# Patient Record
Sex: Male | Born: 1995 | Race: Black or African American | Hispanic: No | Marital: Single | Smoking: Never smoker
Health system: Southern US, Community
[De-identification: ages and names within clinical notes are randomized; demographics above are authoritative.]

## PROBLEM LIST (undated history)

## (undated) DIAGNOSIS — E119 Type 2 diabetes mellitus without complications: Secondary | ICD-10-CM

---
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
## 2019-10-21 ENCOUNTER — Encounter (HOSPITAL_COMMUNITY): Payer: Self-pay

## 2019-10-21 ENCOUNTER — Inpatient Hospital Stay (HOSPITAL_COMMUNITY)
Admission: EM | Admit: 2019-10-21 | Discharge: 2019-10-25 | DRG: 638 | Disposition: A | Discharge status: Home / Self Care | Payer: Self-pay | Source: Ambulatory Visit | Attending: Family Medicine | Admitting: Family Medicine

## 2019-10-21 ENCOUNTER — Ambulatory Visit (HOSPITAL_COMMUNITY)
Admission: EM | Admit: 2019-10-21 | Discharge: 2019-10-21 | Disposition: A | Discharge status: Home / Self Care | Payer: Self-pay | Attending: Family Medicine | Admitting: Family Medicine

## 2019-10-21 ENCOUNTER — Inpatient Hospital Stay (HOSPITAL_COMMUNITY): Payer: Self-pay

## 2019-10-21 ENCOUNTER — Emergency Department (HOSPITAL_COMMUNITY): Payer: Self-pay

## 2019-10-21 ENCOUNTER — Other Ambulatory Visit: Payer: Self-pay

## 2019-10-21 ENCOUNTER — Encounter (HOSPITAL_COMMUNITY): Payer: Self-pay | Admitting: Emergency Medicine

## 2019-10-21 DIAGNOSIS — E871 Hypo-osmolality and hyponatremia: Secondary | ICD-10-CM | POA: Diagnosis present

## 2019-10-21 DIAGNOSIS — N179 Acute kidney failure, unspecified: Secondary | ICD-10-CM

## 2019-10-21 DIAGNOSIS — E860 Dehydration: Secondary | ICD-10-CM | POA: Diagnosis present

## 2019-10-21 DIAGNOSIS — Z20822 Contact with and (suspected) exposure to covid-19: Secondary | ICD-10-CM | POA: Diagnosis present

## 2019-10-21 DIAGNOSIS — Z6841 Body Mass Index (BMI) 40.0 and over, adult: Secondary | ICD-10-CM

## 2019-10-21 DIAGNOSIS — R030 Elevated blood-pressure reading, without diagnosis of hypertension: Secondary | ICD-10-CM

## 2019-10-21 DIAGNOSIS — I10 Essential (primary) hypertension: Secondary | ICD-10-CM | POA: Diagnosis present

## 2019-10-21 DIAGNOSIS — E1110 Type 2 diabetes mellitus with ketoacidosis without coma: Principal | ICD-10-CM | POA: Diagnosis present

## 2019-10-21 DIAGNOSIS — R1033 Periumbilical pain: Secondary | ICD-10-CM

## 2019-10-21 DIAGNOSIS — E119 Type 2 diabetes mellitus without complications: Secondary | ICD-10-CM

## 2019-10-21 DIAGNOSIS — R739 Hyperglycemia, unspecified: Secondary | ICD-10-CM

## 2019-10-21 DIAGNOSIS — D72829 Elevated white blood cell count, unspecified: Secondary | ICD-10-CM | POA: Diagnosis present

## 2019-10-21 DIAGNOSIS — K760 Fatty (change of) liver, not elsewhere classified: Secondary | ICD-10-CM | POA: Diagnosis present

## 2019-10-21 DIAGNOSIS — E876 Hypokalemia: Secondary | ICD-10-CM | POA: Diagnosis present

## 2019-10-21 DIAGNOSIS — E1310 Other specified diabetes mellitus with ketoacidosis without coma: Secondary | ICD-10-CM

## 2019-10-21 HISTORY — DX: Type 2 diabetes mellitus without complications: E11.9

## 2019-10-21 LAB — BASIC METABOLIC PANEL
Anion gap: 23 — ABNORMAL HIGH (ref 5–15)
Anion gap: 18 — ABNORMAL HIGH (ref 5–15)
BUN: 11 mg/dL (ref 6–20)
BUN: 10 mg/dL (ref 6–20)
CO2: 14 mmol/L — ABNORMAL LOW (ref 22–32)
CO2: 13 mmol/L — ABNORMAL LOW (ref 22–32)
Calcium: 10.1 mg/dL (ref 8.9–10.3)
Calcium: 9.6 mg/dL (ref 8.9–10.3)
Chloride: 95 mmol/L — ABNORMAL LOW (ref 98–111)
Chloride: 103 mmol/L (ref 98–111)
Creatinine, Ser: 1.42 mg/dL — ABNORMAL HIGH (ref 0.61–1.24)
Creatinine, Ser: 1.24 mg/dL (ref 0.61–1.24)
GFR calc Af Amer: >60 mL/min (ref >60)
GFR calc Af Amer: >60 mL/min (ref >60)
GFR calc non Af Amer: >60 mL/min (ref >60)
GFR calc non Af Amer: >60 mL/min (ref >60)
Glucose, Bld: 647 mg/dL (ref 70–99)
Glucose, Bld: 392 mg/dL — ABNORMAL HIGH (ref 70–99)
Potassium: 4.7 mmol/L (ref 3.5–5.1)
Potassium: 4.7 mmol/L (ref 3.5–5.1)
Sodium: 132 mmol/L — ABNORMAL LOW (ref 135–145)
Sodium: 134 mmol/L — ABNORMAL LOW (ref 135–145)

## 2019-10-21 LAB — CBG MONITORING, ED
Glucose-Capillary: 260 mg/dL — ABNORMAL HIGH (ref 70–99)
Glucose-Capillary: 275 mg/dL — ABNORMAL HIGH (ref 70–99)
Glucose-Capillary: 306 mg/dL — ABNORMAL HIGH (ref 70–99)
Glucose-Capillary: 326 mg/dL — ABNORMAL HIGH (ref 70–99)
Glucose-Capillary: 382 mg/dL — ABNORMAL HIGH (ref 70–99)
Glucose-Capillary: 432 mg/dL — ABNORMAL HIGH (ref 70–99)
Glucose-Capillary: 512 mg/dL (ref 70–99)
Glucose-Capillary: 536 mg/dL (ref 70–99)
Glucose-Capillary: >600 mg/dL (ref 70–99)

## 2019-10-21 LAB — HEPATIC FUNCTION PANEL
ALT: 50 U/L — ABNORMAL HIGH (ref 0–44)
AST: 44 U/L — ABNORMAL HIGH (ref 15–41)
Albumin: 4.6 g/dL (ref 3.5–5.0)
Alkaline Phosphatase: 117 U/L (ref 38–126)
Bilirubin, Direct: 0.4 mg/dL — ABNORMAL HIGH (ref 0.0–0.2)
Indirect Bilirubin: 1.6 mg/dL — ABNORMAL HIGH (ref 0.3–0.9)
Total Bilirubin: 2 mg/dL — ABNORMAL HIGH (ref 0.3–1.2)
Total Protein: 9.2 g/dL — ABNORMAL HIGH (ref 6.5–8.1)

## 2019-10-21 LAB — CBC
HCT: 50.8 % (ref 39.0–52.0)
HCT: 51.9 % (ref 39.0–52.0)
Hemoglobin: 16.5 g/dL (ref 13.0–17.0)
Hemoglobin: 16.6 g/dL (ref 13.0–17.0)
MCH: 25 pg — ABNORMAL LOW (ref 26.0–34.0)
MCH: 25.1 pg — ABNORMAL LOW (ref 26.0–34.0)
MCHC: 32.5 g/dL (ref 30.0–36.0)
MCHC: 32 g/dL (ref 30.0–36.0)
MCV: 77.1 fL — ABNORMAL LOW (ref 80.0–100.0)
MCV: 78.5 fL — ABNORMAL LOW (ref 80.0–100.0)
Platelets: 442 10*3/uL — ABNORMAL HIGH (ref 150–400)
Platelets: 390 10*3/uL (ref 150–400)
RBC: 6.59 MIL/uL — ABNORMAL HIGH (ref 4.22–5.81)
RBC: 6.61 MIL/uL — ABNORMAL HIGH (ref 4.22–5.81)
RDW: 14.5 % (ref 11.5–15.5)
RDW: 15.1 % (ref 11.5–15.5)
WBC: 17 10*3/uL — ABNORMAL HIGH (ref 4.0–10.5)
WBC: 20.3 10*3/uL — ABNORMAL HIGH (ref 4.0–10.5)
nRBC: 0 % (ref 0.0–0.2)
nRBC: 0 % (ref 0.0–0.2)

## 2019-10-21 LAB — POCT I-STAT EG7
Acid-base deficit: 11 mmol/L — ABNORMAL HIGH (ref 0.0–2.0)
Bicarbonate: 13.4 mmol/L — ABNORMAL LOW (ref 20.0–28.0)
Calcium, Ion: 1.18 mmol/L (ref 1.15–1.40)
HCT: 53 % — ABNORMAL HIGH (ref 39.0–52.0)
Hemoglobin: 18 g/dL — ABNORMAL HIGH (ref 13.0–17.0)
O2 Saturation: 99 %
Potassium: 5.1 mmol/L (ref 3.5–5.1)
Sodium: 131 mmol/L — ABNORMAL LOW (ref 135–145)
TCO2: 14 mmol/L — ABNORMAL LOW (ref 22–32)
pCO2, Ven: 26.6 mmHg — ABNORMAL LOW (ref 44.0–60.0)
pH, Ven: 7.311 (ref 7.250–7.430)
pO2, Ven: 127 mmHg — ABNORMAL HIGH (ref 32.0–45.0)

## 2019-10-21 LAB — URINALYSIS, ROUTINE W REFLEX MICROSCOPIC
Bacteria, UA: NONE SEEN
Bilirubin Urine: NEGATIVE
Glucose, UA: >=500 mg/dL — AB
Ketones, ur: 80 mg/dL — AB
Leukocytes,Ua: NEGATIVE
Nitrite: NEGATIVE
Protein, ur: >=300 mg/dL — AB
Specific Gravity, Urine: 1.03 (ref 1.005–1.030)
pH: 5 (ref 5.0–8.0)

## 2019-10-21 LAB — POCT URINALYSIS DIP (DEVICE)
Glucose, UA: 500 mg/dL — AB
Ketones, ur: >=160 mg/dL — AB
Leukocytes,Ua: NEGATIVE
Nitrite: NEGATIVE
Protein, ur: >=300 mg/dL — AB
Specific Gravity, Urine: >=1.03 (ref 1.005–1.030)
Urobilinogen, UA: 0.2 mg/dL (ref 0.0–1.0)
pH: 5.5 (ref 5.0–8.0)

## 2019-10-21 LAB — HIV ANTIBODY (ROUTINE TESTING W REFLEX): HIV Screen 4th Generation wRfx: NONREACTIVE

## 2019-10-21 LAB — LIPASE, BLOOD: Lipase: 20 U/L (ref 11–51)

## 2019-10-21 LAB — LACTIC ACID, PLASMA
Lactic Acid, Venous: 3.6 mmol/L (ref 0.5–1.9)
Lactic Acid, Venous: 2.1 mmol/L (ref 0.5–1.9)

## 2019-10-21 LAB — GLUCOSE, CAPILLARY: Glucose-Capillary: 536 mg/dL (ref 70–99)

## 2019-10-21 LAB — D-DIMER, QUANTITATIVE: D-Dimer, Quant: 0.31 ug/mL-FEU (ref 0.00–0.50)

## 2019-10-21 LAB — CREATININE, SERUM
Creatinine, Ser: 1.21 mg/dL (ref 0.61–1.24)
GFR calc Af Amer: >60 mL/min (ref >60)
GFR calc non Af Amer: >60 mL/min (ref >60)

## 2019-10-21 LAB — BETA-HYDROXYBUTYRIC ACID: Beta-Hydroxybutyric Acid: 3.94 mmol/L — ABNORMAL HIGH (ref 0.05–0.27)

## 2019-10-21 LAB — SARS CORONAVIRUS 2 (TAT 6-24 HRS): SARS Coronavirus 2: NEGATIVE

## 2019-10-21 MED ORDER — ENOXAPARIN SODIUM 100 MG/ML ~~LOC~~ SOLN
100.0000 mg | SUBCUTANEOUS | Status: DC
  Administered 2019-10-21 – 2019-10-24 (×4): 100 mg via SUBCUTANEOUS
  Filled 2019-10-21 (×7): qty 1

## 2019-10-21 MED ORDER — SODIUM CHLORIDE 0.9 % IV BOLUS
1000.0000 mL | Freq: Once | INTRAVENOUS | Status: AC
  Administered 2019-10-21: 1000 mL via INTRAVENOUS

## 2019-10-21 MED ORDER — TRAZODONE HCL 50 MG PO TABS: 25.0000 mg | ORAL_TABLET | Freq: Every evening | ORAL | Status: DC | PRN

## 2019-10-21 MED ORDER — ONDANSETRON HCL 4 MG PO TABS: 4.0000 mg | ORAL_TABLET | Freq: Four times a day (QID) | ORAL | Status: DC | PRN

## 2019-10-21 MED ORDER — TRAMADOL HCL 50 MG PO TABS: 50.0000 mg | ORAL_TABLET | Freq: Four times a day (QID) | ORAL | Status: DC | PRN

## 2019-10-21 MED ORDER — POTASSIUM CHLORIDE 10 MEQ/100ML IV SOLN
10.0000 meq | INTRAVENOUS | Status: AC
  Administered 2019-10-21 (×2): 10 meq via INTRAVENOUS
  Filled 2019-10-21 (×2): qty 100

## 2019-10-21 MED ORDER — ONDANSETRON HCL 4 MG/2ML IJ SOLN: 4.0000 mg | Freq: Four times a day (QID) | INTRAMUSCULAR | Status: DC | PRN

## 2019-10-21 MED ORDER — ACETAMINOPHEN 650 MG RE SUPP: 650.0000 mg | Freq: Four times a day (QID) | RECTAL | Status: DC | PRN

## 2019-10-21 MED ORDER — PIPERACILLIN-TAZOBACTAM 3.375 G IVPB 30 MIN
3.3750 g | Freq: Once | INTRAVENOUS | Status: AC
  Administered 2019-10-21: 3.375 g via INTRAVENOUS
  Filled 2019-10-21: qty 50

## 2019-10-21 MED ORDER — DOCUSATE SODIUM 100 MG PO CAPS
100.0000 mg | ORAL_CAPSULE | Freq: Two times a day (BID) | ORAL | Status: DC
  Administered 2019-10-21 – 2019-10-25 (×7): 100 mg via ORAL
  Filled 2019-10-21 (×7): qty 1

## 2019-10-21 MED ORDER — HYDRALAZINE HCL 25 MG PO TABS: 25.0000 mg | ORAL_TABLET | Freq: Four times a day (QID) | ORAL | Status: DC | PRN

## 2019-10-21 MED ORDER — ACETAMINOPHEN 325 MG PO TABS: 650.0000 mg | ORAL_TABLET | Freq: Four times a day (QID) | ORAL | Status: DC | PRN

## 2019-10-21 MED ORDER — INSULIN REGULAR(HUMAN) IN NACL 100-0.9 UT/100ML-% IV SOLN
INTRAVENOUS | Status: DC
  Administered 2019-10-21: 18 [IU]/h via INTRAVENOUS
  Administered 2019-10-22: 12 [IU]/h via INTRAVENOUS
  Administered 2019-10-22: 19 [IU]/h via INTRAVENOUS
  Administered 2019-10-22: 16 [IU]/h via INTRAVENOUS
  Administered 2019-10-23: 18 [IU]/h via INTRAVENOUS
  Administered 2019-10-23: 8.5 [IU]/h via INTRAVENOUS
  Administered 2019-10-24: 9 [IU]/h via INTRAVENOUS
  Filled 2019-10-21 (×8): qty 100

## 2019-10-21 MED ORDER — MAGNESIUM HYDROXIDE 400 MG/5ML PO SUSP: 30.0000 mL | Freq: Every day | ORAL | Status: DC | PRN

## 2019-10-21 MED ORDER — DEXTROSE 50 % IV SOLN: 0.0000 mL | INTRAVENOUS | Status: DC | PRN

## 2019-10-21 MED ORDER — PIPERACILLIN-TAZOBACTAM 3.375 G IVPB: 3.3750 g | Freq: Three times a day (TID) | INTRAVENOUS | Status: DC

## 2019-10-21 MED ORDER — SODIUM CHLORIDE 0.9 % IV SOLN: INTRAVENOUS | Status: DC

## 2019-10-21 MED ORDER — BISACODYL 10 MG RE SUPP: 10.0000 mg | Freq: Every day | RECTAL | Status: DC | PRN

## 2019-10-21 MED ORDER — DEXTROSE-NACL 5-0.45 % IV SOLN: INTRAVENOUS | Status: DC

## 2019-10-21 MED ORDER — FLEET ENEMA 7-19 GM/118ML RE ENEM: 1.0000 | ENEMA | Freq: Once | RECTAL | Status: DC | PRN

## 2019-10-21 NOTE — ED Notes (Signed)
160/116 reported to [employer]

## 2019-10-21 NOTE — ED Provider Notes (Signed)
[facility] EMERGENCY DEPARTMENT Provider Note   CSN: [id number] Arrival date & time: 10/21/19  1321     History Chief Complaint  Patient presents with  . Hyperglycemia  . Tachycardia    Michael [name] is a 24 y.o. male who presents to the ED today from urgent care for new onset hyperglycemia. Pt reports that 3 days ago he began having constipation, nausea, and nonbloody nonbilious emesis. He also complains of urinary frequency and SOB. Pt states he went to UC today and was sent here for further evaluation. His blood sugar was 536 and he was found to have > 500 glucose in his urine with 160 ketones and 300 protein. Per chart review they were unable to obtain imaging of his abdomen here due to body habitus and was sent here for further workup. Pt has no hx of diabetes. It appears that he does not frequently go to the doctors as there are no previous notes on him. Pt denies recent sick contacts. Denies fever, chills, chest pain, hematemesis, coffee ground emesis, or any other associated symptoms.   The history is provided by the patient and medical records. No language interpreter was used.       Past Medical History:  Diagnosis Date  . Diabetes mellitus without complication [facility])     Patient Active Problem List   Diagnosis Date Noted  . Sports physical 08/12/2012    History reviewed. No pertinent surgical history.     Family History  Problem Relation Age of Onset  . Sudden death Neg Hx   . Heart attack Neg Hx     Social History   Tobacco Use  . Smoking status: Never Smoker  . Smokeless tobacco: Never Used  Substance Use Topics  . Alcohol use: Not Currently  . Drug use: Not Currently    Home Medications Prior to Admission medications   Not on File    Allergies    Patient has no known allergies.  Review of Systems   Review of Systems  Constitutional: Negative for chills and fever.  HENT: Negative for congestion.   Eyes: Negative for visual  disturbance.  Respiratory: Positive for shortness of breath. Negative for cough.   Cardiovascular: Negative for chest pain and leg swelling.  Gastrointestinal: Positive for abdominal pain, constipation, nausea and vomiting. Negative for blood in stool and diarrhea.  Genitourinary: Positive for frequency.  Musculoskeletal: Negative for myalgias.  Skin: Negative for rash.  Neurological: Negative for headaches.    Physical Exam Updated Vital Signs BP (!) 140/120 (BP Location: Left Arm)   Pulse (!) 139   Temp 97.9 F (36.6 C) (Oral)   Resp 17   SpO2 99%   Physical Exam Vitals and nursing note reviewed.  Constitutional:      Appearance: He is obese. He is not ill-appearing or diaphoretic.  HENT:     Head: Normocephalic and atraumatic.  Eyes:     Conjunctiva/sclera: Conjunctivae normal.  Cardiovascular:     Rate and Rhythm: Regular rhythm. Tachycardia present.  Pulmonary:     Effort: Pulmonary effort is normal.     Breath sounds: Normal breath sounds. No wheezing, rhonchi or rales.  Chest:     Chest wall: No tenderness.  Abdominal:     Palpations: Abdomen is soft.     Tenderness: There is abdominal tenderness. There is no guarding or rebound.     Comments: Soft, + periumbilical abdominal TTP, +BS throughout, no r/g/r, neg murphy's, neg mcburney's, no CVA TTP  Musculoskeletal:     Cervical back: Neck supple.  Skin:    General: Skin is warm and dry.  Neurological:     Mental Status: He is alert.     ED Results / Procedures / Treatments   Labs (all labs ordered are listed, but only abnormal results are displayed) Labs Reviewed  BASIC METABOLIC PANEL - Abnormal; Notable for the following components:      Result Value   Sodium 132 (*)    Chloride 95 (*)    CO2 14 (*)    Glucose, Bld 647 (*)    Creatinine, Ser 1.42 (*)    Anion gap 23 (*)    All other components within normal limits  CBC - Abnormal; Notable for the following components:   WBC 17.0 (*)    RBC 6.59 (*)     MCV 77.1 (*)    MCH 25.0 (*)    Platelets 442 (*)    All other components within normal limits  URINALYSIS, ROUTINE W REFLEX MICROSCOPIC - Abnormal; Notable for the following components:   Color, Urine STRAW (*)    Glucose, UA >=500 (*)    Hgb urine dipstick MODERATE (*)    Ketones, ur 80 (*)    Protein, ur >=300 (*)    All other components within normal limits  LACTIC ACID, PLASMA - Abnormal; Notable for the following components:   Lactic Acid, Venous 3.6 (*)    All other components within normal limits  BETA-HYDROXYBUTYRIC ACID - Abnormal; Notable for the following components:   Beta-Hydroxybutyric Acid 3.94 (*)    All other components within normal limits  HEPATIC FUNCTION PANEL - Abnormal; Notable for the following components:   Total Protein 9.2 (*)    AST 44 (*)    ALT 50 (*)    Total Bilirubin 2.0 (*)    Bilirubin, Direct 0.4 (*)    Indirect Bilirubin 1.6 (*)    All other components within normal limits  CBG MONITORING, ED - Abnormal; Notable for the following components:   Glucose-Capillary >600 (*)    All other components within normal limits  CBG MONITORING, ED - Abnormal; Notable for the following components:   Glucose-Capillary 512 (*)    All other components within normal limits  CBG MONITORING, ED - Abnormal; Notable for the following components:   Glucose-Capillary 432 (*)    All other components within normal limits  POCT I-STAT EG7 - Abnormal; Notable for the following components:   pCO2, Ven 26.6 (*)    pO2, Ven 127.0 (*)    Bicarbonate 13.4 (*)    TCO2 14 (*)    Acid-base deficit 11.0 (*)    Sodium 131 (*)    HCT 53.0 (*)    Hemoglobin 18.0 (*)    All other components within normal limits  SARS CORONAVIRUS 2 (TAT 6-24 HRS)  LIPASE, BLOOD  LACTIC ACID, PLASMA  I-STAT VENOUS BLOOD GAS, ED  CBG MONITORING, ED    EKG EKG Interpretation  Date/Time:  Tuesday October 21 2019 13:50:08 EST Ventricular Rate:  136 PR Interval:  160 QRS  Duration: 74 QT Interval:  272 QTC Calculation: 409 R Axis:   54 Text Interpretation: Sinus tachycardia Right atrial enlargement Possible Anterior infarct , age undetermined Abnormal ECG No prior ECG for comparison. No STEMI Confirmed by Theda Belfast ([id number]) on 10/21/2019 3:31:13 PM   Radiology DG Abd Acute W/Chest  Result Date: 10/21/2019 CLINICAL DATA:  No pain with apparent constipation EXAM: DG ABDOMEN ACUTE  W/ 1V CHEST COMPARISON:  None. FINDINGS: PA chest: Lungs are clear. Heart size and pulmonary vascularity are normal. No adenopathy. Supine and upright abdomen: There is moderate stool throughout the colon. There is no bowel dilatation or air-fluid level to suggest bowel obstruction. No free air. No abnormal calcifications. IMPRESSION: Moderate stool evident throughout the colon. No bowel dilatation or air-fluid level to suggest bowel obstruction. No free air. Lungs clear. Electronically Signed   By: Lowella Grip III M.D.   On: 10/21/2019 15:55    Procedures .Critical Care Performed by: Eustaquio Maize, PA-C Authorized by: Eustaquio Maize, PA-C   Critical care provider statement:    Critical care time (minutes):  45   Critical care was necessary to treat or prevent imminent or life-threatening deterioration of the following conditions:  Endocrine crisis   Critical care was time spent personally by me on the following activities:  Discussions with consultants, evaluation of patient's response to treatment, examination of patient, ordering and performing treatments and interventions, ordering and review of laboratory studies, ordering and review of radiographic studies, pulse oximetry, re-evaluation of patient's condition, obtaining history from patient or surrogate and review of old charts   (including critical care time)  Medications Ordered in ED Medications  insulin regular, human (MYXREDLIN) 100 units/ 100 mL infusion (11 Units/hr Intravenous Rate/Dose Change 10/21/19 1655)   0.9 %  sodium chloride infusion (has no administration in time range)  dextrose 5 %-0.45 % sodium chloride infusion (has no administration in time range)  dextrose 50 % solution 0-50 mL (has no administration in time range)  potassium chloride 10 mEq in 100 mL IVPB (10 mEq Intravenous New Bag/Given 10/21/19 1650)  sodium chloride 0.9 % bolus 1,000 mL (1,000 mLs Intravenous New Bag/Given 10/21/19 1523)  sodium chloride 0.9 % bolus 1,000 mL (1,000 mLs Intravenous New Bag/Given 10/21/19 1650)    ED Course  I have reviewed the triage vital signs and the nursing notes.  Pertinent labs & imaging results that were available during my care of the patient were reviewed by me and considered in my medical decision making (see chart for details).  24 year old male presents the ED today from urgent care for evaluation of new onset hyperglycemia/concern for DKA.  Patient without history of diabetes.  Initially went to urgent care today for constipation, periumbilical abdominal pain, nausea, vomiting, shortness of breath.  Found to have a CBG in the 500s and sent here for further evaluation.  They were also unable to obtain any imaging due to patient's body habitus, he weighs 213 kg.   On exam today patient is noted to be tachycardic and tachypneic although afebrile.  He is satting appropriately and not using any accessory muscles.  He has some very mild periumbilical nominal pain without peritoneal signs.  He reports constipation for the past 3 days and urinary frequency, suspect that the tachycardia is due to dehydration.  CBG obtained prior to patient being seen greater than 600.  Awaiting all labs although CBC has returned with elevated white blood cell count of 17,000, do not have baseline to compare to.   Lactic acid elevated at 3.6.  Again feel that this is likely due to dehydration.  Patient is currently receiving a fluid bolus.  Will obtain VBG and beta hydroxybutyrate and treat as DKA.  Patient will need  to be admitted given new onset.  He does not have a PCP currently and will likely need social work/case management involvement to get him a PCP to treat  his diabetes outpatient.   Abdominal series with chest obtained with moderate stool burden, no small bowel obstruction.  No infection in the chest.  BMP with glucose of 647 bicarb 14 anion gap 23.  Creatinine 1.42.  VBG with pH of 7.3, CO2 of 26, bicarb of 13.  It does not appear patient is fully in DKA yet but will likely get there soon.  He is currently being treated with insulin drip.  Will call for admission.   Clinical Course as of Oct 20 1738  Tue Oct 21, 2019  1705 WBC(!): 17.0 [MV]  1705 Discussed case with Dr. Garald Braver who agrees to accept patient for admission    [MV]  1706 Glucose(!!): 647 [MV]  1706 Creatinine(!): 1.42 [MV]  1706 CO2(!): 14 [MV]  1706 Anion gap(!): 23 [MV]  1706 Beta-Hydroxybutyric Acid(!): 3.94 [MV]    Clinical Course User Index [MV] Tanda Rockers, PA-C   MDM Rules/Calculators/A&P                       Final Clinical Impression(s) / ED Diagnoses Final diagnoses:  Hyperglycemia  Diabetic ketoacidosis without coma associated with type 2 diabetes mellitus [facility])    Rx / DC Orders ED Discharge Orders    None       Tanda Rockers, PA-C 10/21/19 1740    Tegeler, Canary Brim, MD 10/21/19 2348

## 2019-10-21 NOTE — ED Notes (Signed)
Dinner Tray Ordered @ 1807.  

## 2019-10-21 NOTE — ED Notes (Signed)
Patient transported to X-ray 

## 2019-10-21 NOTE — ED Triage Notes (Signed)
Pt presents from UC. He is newly dx diabetic. C/o n/v/tachycardia/abdominal. CBG at triage >600. See provider notes below. Steady gait, a/o speaking clear sentences at triage.   Unable to image abdomen here secondary to body habitus. Given blood sugar and U/A findings along with n/v/tachycardia/abdominal pain, I have sent him to the ED for further evaluation of likely DKA.

## 2019-10-21 NOTE — ED Triage Notes (Signed)
Pt states he is constipated x 2 days. Pt states he has been voiding a lot in the past Yoon days.

## 2019-10-21 NOTE — Discharge Instructions (Signed)
I am sending you to the Emergency Department because your blood sugar here is 536. This is very high and can be very dangerous if left untreated.

## 2019-10-21 NOTE — ED Provider Notes (Addendum)
[facility]ENTER   [id number] 10/21/19 Arrival Time: 1046  ASSESSMENT & PLAN:  1. Periumbilical abdominal pain   2. New onset type 2 diabetes mellitus (HCC)      Labs Reviewed  CBG MONITORING, ED - Abnormal; Notable for the following components:   Glucose-Capillary 536 (*)    All other components within normal limits  POCT URINALYSIS DIP (DEVICE) - Abnormal; Notable for the following components:   Glucose, UA 500 (*)    Bilirubin Urine MODERATE (*)    Ketones, ur >=160 (*)    Hgb urine dipstick MODERATE (*)    Protein, ur >=300 (*)    All other components within normal limits    Unable to image abdomen here secondary to body habitus. Given blood sugar and U/A findings along with n/v/tachycardia/abdominal pain, I have sent him to the ED for further evaluation of likely DKA.  Prefers private vehicle transport; family member. Stable upon d/c here.   Reviewed expectations re: course of current medical issues. Questions answered. Outlined signs and symptoms indicating need for more acute intervention. Patient verbalized understanding. After Visit Summary given.   SUBJECTIVE: History from: patient. Michael [name] is a 24 y.o. male who presents with complaint of fairly persistent periumbilical abdominal pain. Relates that he has not been able to have a bowel movement for three days; feels this is causing pain. Described as cramping and dull; without radiation; does not wake him at night but finding it difficult to sleep as he reports significant nocturia. Also very thirsty recently. Reports that he is passing gas from his rectum but not as much as usual. Symptoms are gradually worsening since beginning. Fever: not believed to be present. Aggravating factors: have not been identified. Alleviating factors: have not been identified. Associated symptoms: nausea and vomiting. He denies diarrhea, headache and myalgias. Appetite: decreased. PO intake: decreased. Ambulatory without  assistance. Urinary symptoms: frequent urination; no hematuria; no penile discharge. No recent illnesses reported. History of similar: no. OTC treatment: Pepto-Bismol without relief.  History reviewed. No pertinent surgical history.  ROS: As per HPI. All other systems negative.  OBJECTIVE:  Vitals:   10/21/19 1155 10/21/19 1157  BP: (!) 160/116   Pulse: (!) 115   Resp: 18   Temp: 99 F (37.2 C)   TempSrc: Oral   SpO2: 100%   Weight:  (!) 213.2 kg    GCS: 15 General appearance: alert, oriented, no acute distress HEENT: [place]; AT; oropharynx dry Lungs: clear to auscultation bilaterally; unlabored respirations Heart: regular; tachycardic Abdomen: difficult exam secondary to body habitus/obesity; soft; without obvious distention; moderate and poorly localized tenderness to palpation over mid to lower abdomen; difficulty appreciating bowel sounds; without obvious masses or organomegaly; without guarding or rebound tenderness Back: without CVA tenderness; FROM at waist Extremities: without LE edema; symmetrical; without gross deformities Skin: warm and dry Neurologic: normal gait Psychological: alert and cooperative; normal mood and affect  Labs: No results found for this or any previous visit. Labs Reviewed  CBG MONITORING, ED     No Known Allergies                                             PMH: "Healthy".  Social History   Socioeconomic History  . Marital status: Single    Spouse name: Not on file  . Number of children: Not on file  . Years  of education: Not on file  . Highest education level: Not on file  Occupational History  . Not on file  Tobacco Use  . Smoking status: Never Smoker  . Smokeless tobacco: Never Used  Substance and Sexual Activity  . Alcohol use: Not on file  . Drug use: Not on file  . Sexual activity: Not on file  Other Topics Concern  . Not on file  Social History Narrative  . Not on file   Social Determinants of Health   Financial  Resource Strain:   . Difficulty of Paying Living Expenses: Not on file  Food Insecurity:   . Worried About Charity fundraiser in the Last Year: Not on file  . Ran Out of Food in the Last Year: Not on file  Transportation Needs:   . Lack of Transportation (Medical): Not on file  . Lack of Transportation (Non-Medical): Not on file  Physical Activity:   . Days of Exercise per Week: Not on file  . Minutes of Exercise per Session: Not on file  Stress:   . Feeling of Stress : Not on file  Social Connections:   . Frequency of Communication with Friends and Family: Not on file  . Frequency of Social Gatherings with Friends and Family: Not on file  . Attends Religious Services: Not on file  . Active Member of Clubs or Organizations: Not on file  . Attends Archivist Meetings: Not on file  . Marital Status: Not on file  Intimate Partner Violence:   . Fear of Current or Ex-Partner: Not on file  . Emotionally Abused: Not on file  . Physically Abused: Not on file  . Sexually Abused: Not on file   Family History  Problem Relation Age of Onset  . Sudden death Neg Hx   . Heart attack Neg Hx   No FH of DM reported.   Vanessa Kick, MD 10/21/19 1316    Vanessa Kick, MD 10/21/19 [phone]

## 2019-10-21 NOTE — ED Notes (Signed)
IOt should be noted that the PT's BP cuff is on his forearm and has been the whole time. Had difficulty getting cuff on upper arm for better reading. Pt. Alert oriented and over all comfortable

## 2019-10-21 NOTE — ED Notes (Signed)
Patient is being discharged from the Urgent Care Center and sent to the Emergency Department via wheelchair by staff. Per Dr Tracie Harrier, patient is stable but in need of higher level of care due to hyperglycemia. Patient is aware and verbalizes understanding of plan of care. Dr Tracie Harrier reviewed plan of care with patient and patient left for ED after provider discussion Vitals:   10/21/19 1155  BP: (!) 160/116  Pulse: (!) 115  Resp: 18  Temp: 99 F (37.2 C)  SpO2: 100%

## 2019-10-21 NOTE — H&P (Signed)
[facility] History and Physical  Friend Dorfman PFX:[id number] DOB: [birth year] DOA: 10/21/2019  Referring physician: ED provider PCP: Patient, No Pcp Per   Chief Complaint: Abdominal pain, constipation  HPI: Michael [name] is a 24 y.o. male with no regular medical care with PMH of morbid obesity with BMI of 60 who presented to the Urgent care today for evaluation of periumbilical abdominal pain with constipation x3 days. His symptoms are described as moderated in intensity, progressively worsened and were associated with nausea, and non bloody, non bilious emesis.  He denied shortness of breath, fever, or chills. At the Urgent care he was found to have blood sugar above 500 with urine ketones of 160 and 300 protein.  He was transported to the ED for further evaluation.    In the ED his abdominal exam was benign with no peritonitis, mild periumbilical pain and normal bowel sounds. CXR showed no consolidation. Acute abd Xray showed no pattern of obstruction and moderate stool burden. His labwork was consistent with DKA with of 23. Scr was elevated at 1.42. Blood glucose was 647. UA was negative for UTI. COVID 19 test was ordered and is pending. He had leukocytosis with WBC of 17K. The hospitalist was called for this admission to the hospital.   Review of Systems:  Constitutional:  No weight loss, night sweats, Fevers, chills, fatigue.  HEENT:  No headaches, Difficulty swallowing Cardio-vascular:  No chest pain, Orthopnea GI:  + abdominal pain, + nausea, +vomiting, + change in bowel habits Resp:  No shortness of breath with exertion or at rest. no productive cough Skin:  no rash or lesions.  GU:  no dysuria, change in color of urine, no urgency or frequency.   Musculoskeletal:  No joint pain or swelling. No decreased range of motion. No back pain.  Psych:  No change in mood or affect. No depression or anxiety. No memory loss.  Heme:  No easy bruising, no spontaneous bleeding.   Past  Medical History:  Diagnosis Date  . Diabetes mellitus without complication (HCC)    History reviewed. No pertinent surgical history. Social History:  reports that he has never smoked. He has never used smokeless tobacco. He reports previous alcohol use. He reports previous drug use.  No Known Allergies  Family History  Problem Relation Age of Onset  . Sudden death Neg Hx   . Heart attack Neg Hx     Prior to Admission medications   Not on File   Physical Exam: Vitals:   10/21/19 1338 10/21/19 1525 10/21/19 1620 10/21/19 1645  BP: (!) 140/120   (!) 163/114  Pulse: (!) 139 (!) 125  (!) 125  Resp: 17 (!) 29  (!) 25  Temp: 97.9 F (36.6 C)     TempSrc: Oral     SpO2: 99% 96%  96%  Height:   6\' 2"  (1.88 m)     Wt Readings from Last 3 Encounters:  10/21/19 (!) 213.2 kg  08/12/12 135.3 kg (>99 %, Z= 3.60)*   * Growth percentiles are based on CDC (Boys, 2-20 Years) data.    General:  Appears calm and comfortable Eyes: PERRL, normal lids, irises & conjunctiva.  ENT: grossly normal hearing, lips & tongue. Dry MM. Neck: supple, nl ROM.  Cardiovascular: S1 and S2, tachycardia.  Respiratory: No respiratory distress, no wheezing.  Abdomen: soft, ND, periumbilical TTP with no rebound tenderness.  Skin: no rash or induration seen on limited exam Musculoskeletal: grossly normal tone BUE/BLE Psychiatric: appropriate mood and  affect Neurologic: Alert and oriented x3, no facial droop.  GU: No CVA tenderness, no suprapubic tenderness          Labs on Admission:  Basic Metabolic Panel: Recent Labs  Lab 10/21/19 1430 10/21/19 1531  NA 132* 131*  K 4.7 5.1  CL 95*  --   CO2 14*  --   GLUCOSE 647*  --   BUN 11  --   CREATININE 1.42*  --   CALCIUM 10.1  --    Liver Function Tests: Recent Labs  Lab 10/21/19 1522  AST 44*  ALT 50*  ALKPHOS 117  BILITOT 2.0*  PROT 9.2*  ALBUMIN 4.6   Recent Labs  Lab 10/21/19 1522  LIPASE 20   No results for input(s): AMMONIA in  the last 168 hours. CBC: Recent Labs  Lab 10/21/19 1430 10/21/19 1531  WBC 17.0*  --   HGB 16.5 18.0*  HCT 50.8 53.0*  MCV 77.1*  --   PLT 442*  --    Cardiac Enzymes: No results for input(s): CKTOTAL, CKMB, CKMBINDEX, TROPONINI in the last 168 hours.  BNP (last 3 results) No results for input(s): BNP in the last 8760 hours.  ProBNP (last 3 results) No results for input(s): PROBNP in the last 8760 hours.  CBG: Recent Labs  Lab 10/21/19 1257 10/21/19 1337 10/21/19 1654 10/21/19 1735  GLUCAP 536*  536* >600* 512* 432*    Radiological Exams on Admission: DG Abd Acute W/Chest  Result Date: 10/21/2019 CLINICAL DATA:  No pain with apparent constipation EXAM: DG ABDOMEN ACUTE W/ 1V CHEST COMPARISON:  None. FINDINGS: PA chest: Lungs are clear. Heart size and pulmonary vascularity are normal. No adenopathy. Supine and upright abdomen: There is moderate stool throughout the colon. There is no bowel dilatation or air-fluid level to suggest bowel obstruction. No free air. No abnormal calcifications. IMPRESSION: Moderate stool evident throughout the colon. No bowel dilatation or air-fluid level to suggest bowel obstruction. No free air. Lungs clear. Electronically Signed   By: Lowella Grip III M.D.   On: 10/21/2019 15:55    EKG:   Assessment/Plan Active Problems:   DKA (diabetic ketoacidoses) ([place])   1. DKA with new onset of diabetes.  Admit to PCU Continue insulin drip with IV fluids, frequent BMPs, diabetes educator consultation, CM consult.  Replete electrolytes as needed.   2. Abdominal pain With no signs of obstruction, no peritonitis.  Will Ordered CT abd/pelvis for eval of possible infectious process due to elevated WBC.  Tramadol as needed for pain, would try to avoid it to prevent worsening constipation.    3. Leukocytosis Of unclear etiology. UA not consistent with UTI. CXR with no consolidation. Afebrile. COVID 19 test ordered and pending but patient with  stable O2 sats, no cough, denies recent exposure.  -Ordered blood cultures -Trend WBC -lactic acid elevated, trend it, continue IV fluids -Ordered CT abd/pelvis for further eval -Start empiric Zosyn, dc if work up above negative.   4. Constipation Start bowel regimen, hydrate.  CT abd/pelvis for further eval per above.   5. Morbid Obesity BMI of 60 Will need lifestyle modification changes  6. Tachycardia Sinus tachycardia Continue telemetry Likely secondary to dehydration Continue IV fluids D dimer wnl  7. AKI v CKD Baseline Scr is unknown Could have AKI due to Dehydration. Will continue IV fluids Monitor UOP Trend Scr Avoid nephrotoxic medications.   8. Elevated BP Unclear if he has history of HTN Will start hydralazine as needed ACEi not  started due to his elevated Scr   Code Status: Full DVT Prophylaxis: Lovenox Family Communication: Discussed with the patient  Disposition Plan: Admit as inpatient with anticipated at least stay   Time spent: 70 minutes  Ky Barban [facility]

## 2019-10-22 ENCOUNTER — Inpatient Hospital Stay (HOSPITAL_COMMUNITY): Payer: Self-pay

## 2019-10-22 ENCOUNTER — Other Ambulatory Visit (HOSPITAL_COMMUNITY): Payer: Self-pay

## 2019-10-22 DIAGNOSIS — R739 Hyperglycemia, unspecified: Secondary | ICD-10-CM

## 2019-10-22 DIAGNOSIS — E1110 Type 2 diabetes mellitus with ketoacidosis without coma: Principal | ICD-10-CM

## 2019-10-22 LAB — HEMOGLOBIN A1C
Hgb A1c MFr Bld: 10 % — ABNORMAL HIGH (ref 4.8–5.6)
Mean Plasma Glucose: 240.3 mg/dL

## 2019-10-22 LAB — CBC
HCT: 48.7 % (ref 39.0–52.0)
Hemoglobin: 16.1 g/dL (ref 13.0–17.0)
MCH: 25.3 pg — ABNORMAL LOW (ref 26.0–34.0)
MCHC: 33.1 g/dL (ref 30.0–36.0)
MCV: 76.5 fL — ABNORMAL LOW (ref 80.0–100.0)
Platelets: 335 10*3/uL (ref 150–400)
RBC: 6.37 MIL/uL — ABNORMAL HIGH (ref 4.22–5.81)
RDW: 14.7 % (ref 11.5–15.5)
WBC: 20.6 10*3/uL — ABNORMAL HIGH (ref 4.0–10.5)
nRBC: 0 % (ref 0.0–0.2)

## 2019-10-22 LAB — CBG MONITORING, ED
Glucose-Capillary: 210 mg/dL — ABNORMAL HIGH (ref 70–99)
Glucose-Capillary: 211 mg/dL — ABNORMAL HIGH (ref 70–99)
Glucose-Capillary: 218 mg/dL — ABNORMAL HIGH (ref 70–99)
Glucose-Capillary: 230 mg/dL — ABNORMAL HIGH (ref 70–99)
Glucose-Capillary: 233 mg/dL — ABNORMAL HIGH (ref 70–99)
Glucose-Capillary: 235 mg/dL — ABNORMAL HIGH (ref 70–99)
Glucose-Capillary: 243 mg/dL — ABNORMAL HIGH (ref 70–99)
Glucose-Capillary: 252 mg/dL — ABNORMAL HIGH (ref 70–99)
Glucose-Capillary: 264 mg/dL — ABNORMAL HIGH (ref 70–99)
Glucose-Capillary: 292 mg/dL — ABNORMAL HIGH (ref 70–99)
Glucose-Capillary: 295 mg/dL — ABNORMAL HIGH (ref 70–99)

## 2019-10-22 LAB — BASIC METABOLIC PANEL
Anion gap: 13 (ref 5–15)
Anion gap: 13 (ref 5–15)
Anion gap: 14 (ref 5–15)
Anion gap: 13 (ref 5–15)
BUN: 14 mg/dL (ref 6–20)
BUN: 17 mg/dL (ref 6–20)
BUN: 20 mg/dL (ref 6–20)
BUN: 16 mg/dL (ref 6–20)
CO2: 19 mmol/L — ABNORMAL LOW (ref 22–32)
CO2: 17 mmol/L — ABNORMAL LOW (ref 22–32)
CO2: 15 mmol/L — ABNORMAL LOW (ref 22–32)
CO2: 17 mmol/L — ABNORMAL LOW (ref 22–32)
Calcium: 10.1 mg/dL (ref 8.9–10.3)
Calcium: 10.2 mg/dL (ref 8.9–10.3)
Calcium: 10.3 mg/dL (ref 8.9–10.3)
Calcium: 10 mg/dL (ref 8.9–10.3)
Chloride: 104 mmol/L (ref 98–111)
Chloride: 104 mmol/L (ref 98–111)
Chloride: 107 mmol/L (ref 98–111)
Chloride: 104 mmol/L (ref 98–111)
Creatinine, Ser: 1.63 mg/dL — ABNORMAL HIGH (ref 0.61–1.24)
Creatinine, Ser: 1.77 mg/dL — ABNORMAL HIGH (ref 0.61–1.24)
Creatinine, Ser: 2.36 mg/dL — ABNORMAL HIGH (ref 0.61–1.24)
Creatinine, Ser: 1.64 mg/dL — ABNORMAL HIGH (ref 0.61–1.24)
GFR calc Af Amer: >60 mL/min (ref >60)
GFR calc Af Amer: >60 mL/min (ref >60)
GFR calc Af Amer: 44 mL/min — ABNORMAL LOW (ref >60)
GFR calc Af Amer: >60 mL/min (ref >60)
GFR calc non Af Amer: 59 mL/min — ABNORMAL LOW (ref >60)
GFR calc non Af Amer: 53 mL/min — ABNORMAL LOW (ref >60)
GFR calc non Af Amer: 38 mL/min — ABNORMAL LOW (ref >60)
GFR calc non Af Amer: 58 mL/min — ABNORMAL LOW (ref >60)
Glucose, Bld: 223 mg/dL — ABNORMAL HIGH (ref 70–99)
Glucose, Bld: 252 mg/dL — ABNORMAL HIGH (ref 70–99)
Glucose, Bld: 211 mg/dL — ABNORMAL HIGH (ref 70–99)
Glucose, Bld: 185 mg/dL — ABNORMAL HIGH (ref 70–99)
Potassium: 4.1 mmol/L (ref 3.5–5.1)
Potassium: 4.3 mmol/L (ref 3.5–5.1)
Potassium: 4.6 mmol/L (ref 3.5–5.1)
Potassium: 3.7 mmol/L (ref 3.5–5.1)
Sodium: 136 mmol/L (ref 135–145)
Sodium: 134 mmol/L — ABNORMAL LOW (ref 135–145)
Sodium: 136 mmol/L (ref 135–145)
Sodium: 134 mmol/L — ABNORMAL LOW (ref 135–145)

## 2019-10-22 LAB — GLUCOSE, CAPILLARY
Glucose-Capillary: 158 mg/dL — ABNORMAL HIGH (ref 70–99)
Glucose-Capillary: 162 mg/dL — ABNORMAL HIGH (ref 70–99)
Glucose-Capillary: 173 mg/dL — ABNORMAL HIGH (ref 70–99)
Glucose-Capillary: 174 mg/dL — ABNORMAL HIGH (ref 70–99)
Glucose-Capillary: 183 mg/dL — ABNORMAL HIGH (ref 70–99)
Glucose-Capillary: 189 mg/dL — ABNORMAL HIGH (ref 70–99)
Glucose-Capillary: 190 mg/dL — ABNORMAL HIGH (ref 70–99)
Glucose-Capillary: 190 mg/dL — ABNORMAL HIGH (ref 70–99)
Glucose-Capillary: 195 mg/dL — ABNORMAL HIGH (ref 70–99)
Glucose-Capillary: 237 mg/dL — ABNORMAL HIGH (ref 70–99)

## 2019-10-22 LAB — TSH: TSH: 1.39 u[IU]/mL (ref 0.350–4.500)

## 2019-10-22 LAB — BRAIN NATRIURETIC PEPTIDE: B Natriuretic Peptide: 16.9 pg/mL (ref 0.0–100.0)

## 2019-10-22 MED ORDER — INSULIN STARTER KIT- PEN NEEDLES (ENGLISH)
1.0000 | Freq: Once | Status: AC
  Administered 2019-10-22: 1
  Filled 2019-10-22: qty 1

## 2019-10-22 MED ORDER — LIVING WELL WITH DIABETES BOOK
Freq: Once | Status: AC
  Filled 2019-10-22: qty 1

## 2019-10-22 NOTE — ED Notes (Signed)
ED TO INPATIENT HANDOFF REPORT  ED Nurse Name and Phone #: Percival Spanish [phone]  S Name/Age/Gender Michael [name] 24 y.o. male Room/Bed: 026C/026C  Code Status   Code Status: Full Code  Home/SNF/Other Home Patient oriented to: self, place, time and situation Is this baseline? Yes   Triage Complete: Triage complete  Chief Complaint DKA (diabetic ketoacidoses) (HCC) [E11.10]  Triage Note Pt presents from UC. He is newly dx diabetic. C/o n/v/tachycardia/abdominal. CBG at triage >600. See provider notes below. Steady gait, a/o speaking clear sentences at triage.   Unable to image abdomen here secondary to body habitus. Given blood sugar and U/A findings along with n/v/tachycardia/abdominal pain, I have sent him to the ED for further evaluation of likely DKA.    Allergies No Known Allergies  Level of Care/Admitting Diagnosis ED Disposition    ED Disposition Condition Comment   Admit  Hospital Area: [facility] [[id number]]  Level of Care: Progressive [102]  Admit to Progressive based on following criteria: GI, ENDOCRINE disease patients with GI bleeding, acute liver failure or pancreatitis, stable with diabetic ketoacidosis or thyrotoxicosis (hypothyroid) state.  Covid Evaluation: Asymptomatic Screening Protocol (No Symptoms)  Diagnosis: DKA (diabetic ketoacidoses) [facility]) [[id number]]  Admitting Physician: Ky Barban [[id number]]  Attending Physician: Sara Chu D [[id number]]  Estimated length of stay: 3 - 4 days  Certification:: I certify this patient will need inpatient services for at least 2 midnights       B Medical/Surgery History Past Medical History:  Diagnosis Date  . Diabetes mellitus without complication (HCC)    History reviewed. No pertinent surgical history.   A IV Location/Drains/Wounds Patient Lines/Drains/Airways Status   Active Line/Drains/Airways    Name:   Placement date:   Placement time:   Site:   Days:   Peripheral IV 10/21/19 Right  Hand   10/21/19    1520    Hand   1   Peripheral IV 10/21/19 Right Antecubital   10/21/19    1615    Antecubital   1          Intake/Output Last 24 hours  Intake/Output Summary (Last 24 hours) at 10/22/2019 1352 Last data filed at 10/22/2019 1115 Gross per 24 hour  Intake 1215.05 ml  Output 700 ml  Net 515.05 ml    Labs/Imaging Results for orders placed or performed during the hospital encounter of 10/21/19 (from the past 48 hour(s))  CBG monitoring, ED     Status: Abnormal   Collection Time: 10/21/19  1:37 PM  Result Value Ref Range   Glucose-Capillary >600 (HH) 70 - 99 mg/dL  Basic metabolic panel     Status: Abnormal   Collection Time: 10/21/19  2:30 PM  Result Value Ref Range   Sodium 132 (L) 135 - 145 mmol/L   Potassium 4.7 3.5 - 5.1 mmol/L   Chloride 95 (L) 98 - 111 mmol/L   CO2 14 (L) 22 - 32 mmol/L   Glucose, Bld 647 (HH) 70 - 99 mg/dL    Comment: CRITICAL RESULT CALLED TO, READ BACK BY AND VERIFIED WITH: KOONTZ,N RN @ 1509 10/21/19 LEONARD,A    BUN 11 6 - 20 mg/dL   Creatinine, Ser 1.02 (H) 0.61 - 1.24 mg/dL   Calcium 58.5 8.9 - 27.7 mg/dL   GFR calc non Af Amer >60 >60 mL/min   GFR calc Af Amer >60 >60 mL/min   Anion gap 23 (H) 5 - 15    Comment: Performed at [facility], [address] [place], [place] [address]  CBC     Status: Abnormal   Collection Time: 10/21/19  2:30 PM  Result Value Ref Range   WBC 17.0 (H) 4.0 - 10.5 K/uL   RBC 6.59 (H) 4.22 - 5.81 MIL/uL   Hemoglobin 16.5 13.0 - 17.0 g/dL   HCT 25.0 53.9 - 76.7 %   MCV 77.1 (L) 80.0 - 100.0 fL   MCH 25.0 (L) 26.0 - 34.0 pg   MCHC 32.5 30.0 - 36.0 g/dL   RDW 34.1 93.7 - 90.2 %   Platelets 442 (H) 150 - 400 K/uL   nRBC 0.0 0.0 - 0.2 %    Comment: Performed at [facility], [address] [place], [place] [address]  Lactic acid, plasma     Status: Abnormal   Collection Time: 10/21/19  2:30 PM  Result Value Ref Range   Lactic Acid, Venous 3.6 (HH) 0.5 - 1.9 mmol/L    Comment:  CRITICAL RESULT CALLED TO, READ BACK BY AND VERIFIED WITH: KOONTZ,N RN @ [phone] 10/21/19 LEONARD,A Performed at [facility], [address], [place], [place] [address]   Urinalysis, Routine w reflex microscopic     Status: Abnormal   Collection Time: 10/21/19  2:55 PM  Result Value Ref Range   Color, Urine STRAW (A) YELLOW   APPearance CLEAR CLEAR   Specific Gravity, Urine 1.030 1.005 - 1.030   pH 5.0 5.0 - 8.0   Glucose, UA >=500 (A) NEGATIVE mg/dL   Hgb urine dipstick MODERATE (A) NEGATIVE   Bilirubin Urine NEGATIVE NEGATIVE   Ketones, ur 80 (A) NEGATIVE mg/dL   Protein, ur >=834 (A) NEGATIVE mg/dL   Nitrite NEGATIVE NEGATIVE   Leukocytes,Ua NEGATIVE NEGATIVE   RBC / HPF 6-10 0 - 5 RBC/hpf   WBC, UA 0-5 0 - 5 WBC/hpf   Bacteria, UA NONE SEEN NONE SEEN   Mucus PRESENT     Comment: Performed at [facility], [address], [place], [place] [address]  SARS CORONAVIRUS 2 (TAT 6-24 HRS) Nasopharyngeal Nasopharyngeal Swab     Status: None   Collection Time: 10/21/19  3:18 PM   Specimen: Nasopharyngeal Swab  Result Value Ref Range   SARS Coronavirus 2 NEGATIVE NEGATIVE    Comment: (NOTE) SARS-CoV-2 target nucleic acids are NOT DETECTED. The SARS-CoV-2 RNA is generally detectable in upper and lower respiratory specimens during the acute phase of infection. Negative results do not preclude SARS-CoV-2 infection, do not rule out co-infections with other pathogens, and should not be used as the sole basis for treatment or other patient management decisions. Negative results must be combined with clinical observations, patient history, and epidemiological information. The expected result is Negative. Fact Sheet for Patients: HairSlick.no Fact Sheet for Healthcare Providers: quierodirigir.com This test is not yet approved or cleared by the [place] FDA and  has been authorized for detection and/or diagnosis of  SARS-CoV-2 by FDA under an Emergency Use Authorization (EUA). This EUA will remain  in effect (meaning this test can be used) for the duration of the COVID-19 declaration under Section 56 4(b)(1) of the Act, 21 U.S.C. section 360bbb-3(b)(1), unless the authorization is terminated or revoked sooner. Performed at [facility], [address], [place], [place] [address]   Beta-hydroxybutyric acid     Status: Abnormal   Collection Time: 10/21/19  3:22 PM  Result Value Ref Range   Beta-Hydroxybutyric Acid 3.94 (H) 0.05 - 0.27 mmol/L  Comment: RESULTS CONFIRMED BY MANUAL DILUTION Performed at [facility], [place] [address] [place], [place] [address]   Hepatic function panel     Status: Abnormal   Collection Time: 10/21/19  3:22 PM  Result Value Ref Range   Total Protein 9.2 (H) 6.5 - 8.1 g/dL   Albumin 4.6 3.5 - 5.0 g/dL   AST 44 (H) 15 - 41 U/L    Comment: SLIGHT HEMOLYSIS   ALT 50 (H) 0 - 44 U/L    Comment: SLIGHT HEMOLYSIS   Alkaline Phosphatase 117 38 - 126 U/L   Total Bilirubin 2.0 (H) 0.3 - 1.2 mg/dL    Comment: SLIGHT HEMOLYSIS   Bilirubin, Direct 0.4 (H) 0.0 - 0.2 mg/dL   Indirect Bilirubin 1.6 (H) 0.3 - 0.9 mg/dL    Comment: Performed at [place] [address] [place], [place] [address]  Lipase, blood     Status: None   Collection Time: 10/21/19  3:22 PM  Result Value Ref Range   Lipase 20 11 - 51 U/L    Comment: Performed at [facility], [place] [address] [place], [place] [address]  POCT I-Stat EG7     Status: Abnormal   Collection Time: 10/21/19  3:31 PM  Result Value Ref Range   pH, Ven 7.311 7.250 - 7.430   pCO2, Ven 26.6 (L) 44.0 - 60.0 mmHg   pO2, Ven 127.0 (H) 32.0 - 45.0 mmHg   Bicarbonate 13.4 (L) 20.0 - 28.0 mmol/L   TCO2 14 (L) 22 - 32 mmol/L   O2 Saturation 99.0 %   Acid-base deficit 11.0 (H) 0.0 - 2.0 mmol/L   Sodium 131 (L) 135 - 145 mmol/L   Potassium 5.1 3.5 - 5.1 mmol/L   Calcium, Ion 1.18 1.15 - 1.40 mmol/L   HCT  53.0 (H) 39.0 - 52.0 %   Hemoglobin 18.0 (H) 13.0 - 17.0 g/dL   Patient temperature HIDE    Sample type VENOUS   CBG monitoring, ED     Status: Abnormal   Collection Time: 10/21/19  4:54 PM  Result Value Ref Range   Glucose-Capillary 512 (HH) 70 - 99 mg/dL   Comment 1 Notify RN   CBG monitoring, ED     Status: Abnormal   Collection Time: 10/21/19  5:35 PM  Result Value Ref Range   Glucose-Capillary 432 (H) 70 - 99 mg/dL  CBG monitoring, ED     Status: Abnormal   Collection Time: 10/21/19  6:22 PM  Result Value Ref Range   Glucose-Capillary 382 (H) 70 - 99 mg/dL  Creatinine, serum     Status: None   Collection Time: 10/21/19  6:30 PM  Result Value Ref Range   Creatinine, Ser 1.21 0.61 - 1.24 mg/dL   GFR calc non Af Amer >60 >60 mL/min   GFR calc Af Amer >60 >60 mL/min    Comment: Performed at [facility], [place] [address], [place], [place] [address]  D-dimer, quantitative (not at [facility])     Status: None   Collection Time: 10/21/19  6:30 PM  Result Value Ref Range   D-Dimer, Quant 0.31 0.00 - 0.50 ug/mL-FEU    Comment: (NOTE) At the manufacturer cut-off of 0.50 ug/mL FEU, this assay has been documented to exclude PE with a sensitivity and negative predictive value of 97 to 99%.  At this time, this assay has not been approved by the FDA to exclude DVT/VTE. Results should be correlated with clinical  presentation. Performed at [facility], [address], [place], [place] [address]   HIV Antibody (routine testing w rflx)     Status: None   Collection Time: 10/21/19  7:09 PM  Result Value Ref Range   HIV Screen 4th Generation wRfx NON REACTIVE NON REACTIVE    Comment: Performed at [facility], [address] [place], [place] [address]  CBC     Status: Abnormal   Collection Time: 10/21/19  7:09 PM  Result Value Ref Range   WBC 20.3 (H) 4.0 - 10.5 K/uL   RBC 6.61 (H) 4.22 - 5.81 MIL/uL   Hemoglobin 16.6 13.0 - 17.0 g/dL   HCT 09.8 11.9 - 14.7 %   MCV 78.5  (L) 80.0 - 100.0 fL   MCH 25.1 (L) 26.0 - 34.0 pg   MCHC 32.0 30.0 - 36.0 g/dL   RDW 82.9 56.2 - 13.0 %   Platelets 390 150 - 400 K/uL   nRBC 0.0 0.0 - 0.2 %    Comment: Performed at [facility], [address], [place], [place] [address]  Basic metabolic panel     Status: Abnormal   Collection Time: 10/21/19  7:09 PM  Result Value Ref Range   Sodium 134 (L) 135 - 145 mmol/L   Potassium 4.7 3.5 - 5.1 mmol/L   Chloride 103 98 - 111 mmol/L   CO2 13 (L) 22 - 32 mmol/L   Glucose, Bld 392 (H) 70 - 99 mg/dL   BUN 10 6 - 20 mg/dL   Creatinine, Ser 4.69 0.61 - 1.24 mg/dL   Calcium 9.6 8.9 - 62.9 mg/dL   GFR calc non Af Amer >60 >60 mL/min   GFR calc Af Amer >60 >60 mL/min   Anion gap 18 (H) 5 - 15    Comment: Performed at [facility], [address] [place], [place] [address]  Culture, blood (routine x 2)     Status: None (Preliminary result)   Collection Time: 10/21/19  7:09 PM   Specimen: BLOOD  Result Value Ref Range   Specimen Description BLOOD LEFT ANTECUBITAL    Special Requests      BOTTLES DRAWN AEROBIC AND ANAEROBIC Blood Culture results may not be optimal due to an inadequate volume of blood received in culture bottles   Culture      NO GROWTH < 24 HOURS Performed at [facility], [address] [place], [place] [address]    Report Status PENDING   CBG monitoring, ED     Status: Abnormal   Collection Time: 10/21/19  8:08 PM  Result Value Ref Range   Glucose-Capillary 326 (H) 70 - 99 mg/dL  Lactic acid, plasma     Status: Abnormal   Collection Time: 10/21/19  8:42 PM  Result Value Ref Range   Lactic Acid, Venous 2.1 (HH) 0.5 - 1.9 mmol/L    Comment: CRITICAL VALUE NOTED.  VALUE IS CONSISTENT WITH PREVIOUSLY REPORTED AND CALLED VALUE. Performed at [facility], [address], [place], [place] [address]   CBG monitoring, ED     Status: Abnormal   Collection Time: 10/21/19  9:31 PM  Result Value Ref Range   Glucose-Capillary 306 (H) 70 - 99  mg/dL  CBG monitoring, ED     Status: Abnormal   Collection Time: 10/21/19 10:46 PM  Result Value Ref Range   Glucose-Capillary 275 (H) 70 - 99 mg/dL  CBG monitoring, ED  Status: Abnormal   Collection Time: 10/21/19 11:53 PM  Result Value Ref Range   Glucose-Capillary 260 (H) 70 - 99 mg/dL  CBG monitoring, ED     Status: Abnormal   Collection Time: 10/22/19 12:58 AM  Result Value Ref Range   Glucose-Capillary 264 (H) 70 - 99 mg/dL  CBG monitoring, ED     Status: Abnormal   Collection Time: 10/22/19  2:13 AM  Result Value Ref Range   Glucose-Capillary 211 (H) 70 - 99 mg/dL  CBG monitoring, ED     Status: Abnormal   Collection Time: 10/22/19  3:33 AM  Result Value Ref Range   Glucose-Capillary 252 (H) 70 - 99 mg/dL  CBG monitoring, ED     Status: Abnormal   Collection Time: 10/22/19  4:50 AM  Result Value Ref Range   Glucose-Capillary 210 (H) 70 - 99 mg/dL  Hemoglobin M5H     Status: Abnormal   Collection Time: 10/22/19  5:00 AM  Result Value Ref Range   Hgb A1c MFr Bld 10.0 (H) 4.8 - 5.6 %    Comment: (NOTE) Pre diabetes:          5.7%-6.4% Diabetes:              >6.4% Glycemic control for   <7.0% adults with diabetes    Mean Plasma Glucose 240.3 mg/dL    Comment: Performed at [facility], [address] [place], [place] [address]  TSH     Status: None   Collection Time: 10/22/19  5:42 AM  Result Value Ref Range   TSH 1.390 0.350 - 4.500 uIU/mL    Comment: Performed by a 3rd Generation assay with a functional sensitivity of <=0.01 uIU/mL. Performed at [facility], [address] [place], [place] [address]   Basic metabolic panel     Status: Abnormal   Collection Time: 10/22/19  5:42 AM  Result Value Ref Range   Sodium 136 135 - 145 mmol/L   Potassium 4.1 3.5 - 5.1 mmol/L   Chloride 104 98 - 111 mmol/L   CO2 19 (L) 22 - 32 mmol/L   Glucose, Bld 223 (H) 70 - 99 mg/dL   BUN 14 6 - 20 mg/dL   Creatinine, Ser 4.13 (H) 0.61 - 1.24 mg/dL   Calcium 24.4  8.9 - 01.0 mg/dL   GFR calc non Af Amer 59 (L) >60 mL/min   GFR calc Af Amer >60 >60 mL/min   Anion gap 13 5 - 15    Comment: Performed at [facility], [address] [place], [place] [address]  CBC     Status: Abnormal   Collection Time: 10/22/19  5:42 AM  Result Value Ref Range   WBC 20.6 (H) 4.0 - 10.5 K/uL   RBC 6.37 (H) 4.22 - 5.81 MIL/uL   Hemoglobin 16.1 13.0 - 17.0 g/dL   HCT 66.4 40.3 - 47.4 %   MCV 76.5 (L) 80.0 - 100.0 fL   MCH 25.3 (L) 26.0 - 34.0 pg   MCHC 33.1 30.0 - 36.0 g/dL   RDW 25.9 56.3 - 87.5 %   Platelets 335 150 - 400 K/uL   nRBC 0.0 0.0 - 0.2 %    Comment: Performed at [facility], [address], [place], [place] [address]  Brain natriuretic peptide     Status: None   Collection Time: 10/22/19  5:42 AM  Result Value Ref Range   B Natriuretic Peptide 16.9 0.0 -  100.0 pg/mL    Comment: Performed at [facility], [address] [place], [place] [address]  CBG monitoring, ED     Status: Abnormal   Collection Time: 10/22/19  6:07 AM  Result Value Ref Range   Glucose-Capillary 233 (H) 70 - 99 mg/dL  CBG monitoring, ED     Status: Abnormal   Collection Time: 10/22/19  7:08 AM  Result Value Ref Range   Glucose-Capillary 218 (H) 70 - 99 mg/dL  CBG monitoring, ED     Status: Abnormal   Collection Time: 10/22/19  8:26 AM  Result Value Ref Range   Glucose-Capillary 292 (H) 70 - 99 mg/dL  CBG monitoring, ED     Status: Abnormal   Collection Time: 10/22/19  9:40 AM  Result Value Ref Range   Glucose-Capillary 295 (H) 70 - 99 mg/dL   Comment 1 Notify RN    Comment 2 Document in Chart   CBG monitoring, ED     Status: Abnormal   Collection Time: 10/22/19 10:52 AM  Result Value Ref Range   Glucose-Capillary 230 (H) 70 - 99 mg/dL  CBG monitoring, ED     Status: Abnormal   Collection Time: 10/22/19 12:08 PM  Result Value Ref Range   Glucose-Capillary 243 (H) 70 - 99 mg/dL  Basic metabolic panel     Status: Abnormal   Collection Time:  10/22/19 12:09 PM  Result Value Ref Range   Sodium 134 (L) 135 - 145 mmol/L   Potassium 4.3 3.5 - 5.1 mmol/L   Chloride 104 98 - 111 mmol/L   CO2 17 (L) 22 - 32 mmol/L   Glucose, Bld 252 (H) 70 - 99 mg/dL   BUN 17 6 - 20 mg/dL   Creatinine, Ser 6.041.77 (H) 0.61 - 1.24 mg/dL   Calcium 54.010.2 8.9 - 98.110.3 mg/dL   GFR calc non Af Amer 53 (L) >60 mL/min   GFR calc Af Amer >60 >60 mL/min   Anion gap 13 5 - 15    Comment: Performed at [facility], [address] [place], [place] [address]  CBG monitoring, ED     Status: Abnormal   Collection Time: 10/22/19  1:22 PM  Result Value Ref Range   Glucose-Capillary 235 (H) 70 - 99 mg/dL   CT ABDOMEN PELVIS WO CONTRAST  Result Date: 10/21/2019 CLINICAL DATA:  24 year old male with 3 days of nausea vomiting constipation urinary frequency and shortness of breath. Hyperglycemia on presentation. EXAM: CT ABDOMEN AND PELVIS WITHOUT CONTRAST TECHNIQUE: Multidetector CT imaging of the abdomen and pelvis was performed following the standard protocol without IV contrast. COMPARISON:  Report of CT Abdomen and Pelvis 05/05/2002 (no images available). FINDINGS: Lower chest: Negative. Hepatobiliary: Hepatic steatosis with some focal fatty sparing near the gallbladder fossa. Gallbladder appears negative. No bile duct enlargement. Pancreas: Fatty atrophy, but otherwise negative. Spleen: Negative. Adrenals/Urinary Tract: Normal adrenal glands. Negative noncontrast kidneys. No nephrolithiasis or hydronephrosis. Decompressed proximal ureters. Normal distal ureters. Diminutive and unremarkable urinary bladder. Stomach/Bowel: Decompressed and negative large bowel with no significant retained stool. Normal appendix on series 3, image 70. The sigmoid is mildly redundant. Negative terminal ileum. No dilated small bowel. Decompressed and negative stomach. No free air, free fluid. Vascular/Lymphatic: Vascular patency is not evaluated in the absence of IV contrast. No calcified  atherosclerosis. No lymphadenopathy. Reproductive: Negative. Other: No pelvic free fluid. Musculoskeletal: Lower lumbar disc bulging and endplate spurring. No acute osseous abnormality identified. IMPRESSION: Negative noncontrast CT Abdomen and  Pelvis aside from hepatic steatosis and fatty atrophy of the pancreas. Electronically Signed   By: Odessa Fleming M.D.   On: 10/21/2019 20:00   DG Abd Acute W/Chest  Result Date: 10/21/2019 CLINICAL DATA:  No pain with apparent constipation EXAM: DG ABDOMEN ACUTE W/ 1V CHEST COMPARISON:  None. FINDINGS: PA chest: Lungs are clear. Heart size and pulmonary vascularity are normal. No adenopathy. Supine and upright abdomen: There is moderate stool throughout the colon. There is no bowel dilatation or air-fluid level to suggest bowel obstruction. No free air. No abnormal calcifications. IMPRESSION: Moderate stool evident throughout the colon. No bowel dilatation or air-fluid level to suggest bowel obstruction. No free air. Lungs clear. Electronically Signed   By: Bretta Bang III M.D.   On: 10/21/2019 15:55    Pending Labs Unresulted Labs (From admission, onward)    Start     Ordered   10/28/19 0500  Creatinine, serum  (enoxaparin (LOVENOX)    CrCl >/= 30 ml/min)  Weekly,   R    Comments: while on enoxaparin therapy    10/21/19 1746   10/23/19 0500  Basic metabolic panel  Tomorrow morning,   R     10/22/19 0807   10/23/19 0500  CBC  Tomorrow morning,   R     10/22/19 0807   10/21/19 1813  Culture, blood (routine x 2)  BLOOD CULTURE X 2,   R (with STAT occurrences)     10/21/19 1812          Vitals/Pain Today's Vitals   10/22/19 0827 10/22/19 0950 10/22/19 0957 10/22/19 1334  BP: (!) 138/116 (!) 148/86  (!) 141/101  Pulse: (!) 123 (!) 118 (!) 114 (!) 113  Resp: 15 15 (!) 29 14  Temp:      TempSrc:      SpO2: 96% 96% 93% 96%  Height:      PainSc:        Isolation Precautions No active isolations  Medications Medications  insulin regular,  human (MYXREDLIN) 100 units/ 100 mL infusion (16 Units/hr Intravenous Rate/Dose Change 10/22/19 1323)  0.9 %  sodium chloride infusion ( Intravenous Stopped 10/22/19 0444)  dextrose 5 %-0.45 % sodium chloride infusion ( Intravenous New Bag/Given 10/22/19 0225)  dextrose 50 % solution 0-50 mL (has no administration in time range)  enoxaparin (LOVENOX) injection 100 mg (100 mg Subcutaneous Given 10/21/19 2011)  acetaminophen (TYLENOL) tablet 650 mg (has no administration in time range)    Or  acetaminophen (TYLENOL) suppository 650 mg (has no administration in time range)  traMADol (ULTRAM) tablet 50 mg (has no administration in time range)  traZODone (DESYREL) tablet 25 mg (has no administration in time range)  docusate sodium (COLACE) capsule 100 mg (100 mg Oral Given 10/22/19 1037)  magnesium hydroxide (MILK OF MAGNESIA) suspension 30 mL (has no administration in time range)  bisacodyl (DULCOLAX) suppository 10 mg (has no administration in time range)  sodium phosphate (FLEET) 7-19 GM/118ML enema 1 enema (has no administration in time range)  ondansetron (ZOFRAN) tablet 4 mg (has no administration in time range)    Or  ondansetron (ZOFRAN) injection 4 mg (has no administration in time range)  hydrALAZINE (APRESOLINE) tablet 25 mg (has no administration in time range)  sodium chloride 0.9 % bolus 1,000 mL (0 mLs Intravenous Stopped 10/21/19 1840)  sodium chloride 0.9 % bolus 1,000 mL (0 mLs Intravenous Stopped 10/21/19 1959)  potassium chloride 10 mEq in 100 mL IVPB (0 mEq Intravenous Stopped 10/21/19  1959)  piperacillin-tazobactam (ZOSYN) IVPB 3.375 g (0 g Intravenous Stopped 10/21/19 2132)    Mobility walks Low fall risk   Focused Assessments    R Recommendations: See Admitting Provider Note  Report given to:   Additional Notes:

## 2019-10-22 NOTE — ED Notes (Signed)
Report given to Shanda Bumps, RN on 4E

## 2019-10-22 NOTE — Progress Notes (Addendum)
Inpatient Diabetes Program Recommendations  AACE/ADA: New Consensus Statement on Inpatient Glycemic Control (2015)  Target Ranges:  Prepandial:   less than 140 mg/dL      Peak postprandial:   less than 180 mg/dL (1-2 hours)      Critically ill patients:  140 - 180 mg/dL   Lab Results  Component Value Date   GLUCAP 190 (H) 10/22/2019   HGBA1C 10.0 (H) 10/22/2019    Review of Glycemic Control  Diabetes history: New Diagnosis this admission A1c 10%  Current orders for Inpatient glycemic control:  IV insulin/Endotool DKA protocol  Pts insulin gtt rate is 12-19 units/hour. Pt is very resistant to insulin. Will caution transitioning overnight. Will be able to see pt needs better if IV insulin continued overnight. Will suspect will need at least 0.3 units/kg for basal insulin dose needs.  Spoke with patient about new diabetes diagnosis.  Discussed A1C results (10% this admission) and explained what an A1C is. Discussed basic pathophysiology of DM Type 2, basic home care, importance of checking CBGs and maintaining good CBG control to prevent long-term and short-term complications. Reviewed glucose and A1C goals  Reviewed signs and symptoms of hyperglycemia and hypoglycemia along with treatment for both. Discussed impact of nutrition, exercise, stress, sickness, and medications on diabetes control. Reviewed Living Well with diabetes booklet and encouraged patient to read through entire book. Informed patient that he will be prescribed insulin (hopefully insulin pen) at time of d/c.  Provided patient with handout information on Reli-On products. Asked patient to check his glucose at least 2 times per day (fasting and alternating second check) and to keep a log book of glucose readings and insulin taken. Explained how the doctor he follows up with can use the log book to continue to make insulin adjustments if needed.   Educated patient on insulin pen use at home. Reviewed contents of insulin  flexpen starter kit. Reviewed all steps if insulin pen including attachment of needle, 2-unit air shot, dialing up dose, giving injection, removing needle, disposal of sharps, storage of unused insulin, disposal of insulin etc. Patient able to provide successful return demonstration. Also reviewed troubleshooting with insulin pen. MD to give patient Rxs for insulin pens and insulin pen needles.  Patient verbalized understanding of information discussed and he states that he has no further questions at this time related to diabetes.   RNs to provide ongoing basic DM education at bedside with this patient and engage patient to actively check blood glucose and administer insulin injections.    Pt will need clinic follow up to access insulin. Suspect pt reaches below poverty line being unemployed. Pt will be able to access the [facility] at the [facility] that has Humalog and Basaglar insulins.   Thanks, Tama Headings RN, MSN, BC-ADM Inpatient Diabetes Coordinator Team Pager [phone] (8a-5p)

## 2019-10-22 NOTE — ED Notes (Signed)
Breakfast ordered 

## 2019-10-22 NOTE — Progress Notes (Addendum)
PROGRESS NOTE    Michael [name]  ZOX:[id number] DOB: 15-Apr-1996 DOA: 10/21/2019 PCP: Patient, No Pcp Per  Brief Narrative: Michael [name] is a 24 y.o. male with no regular medical care with PMH of morbid obesity with BMI of 60   presented to the Urgent care 1/12 for evaluation of periumbilical abdominal pain with constipation x3 days,  associated with nausea, and non bloody, non bilious emesis.  He denied shortness of breath, fever, or chills. At the Urgent care he was found to have blood sugar above 500 with urine ketones of 160 and 300 protein.  He was transported to the ED for further evaluation.  In the emergency room he was noted to have a blood glucose of 647 with diabetic ketoacidosis, acute renal failure, hyponatremia, leukocytosis, CT abdomen pelvis noted fatty liver  Assessment & Plan:   Diabetic ketoacidosis New diabetes mellitus -Continue n.p.o. except ice chips, water -Insulin drip and IV fluids per protocol -Monitor B met every 6 hours -Hemoglobin A1c is 10.0 -Diabetes coordinator consult -Dietitian consulted -Once ketoacidosis is corrected we will start long-acting insulin, start diet  Acute kidney injury -Likely acute in the setting of dehydration from renal failure -monitor with hydration  Leukocytosis, hemoconcentration -Likely reactive, and from hemoconcentration, he is afebrile, COVID-19 PCR is negative -CT benign, afebrile, monitor  Sinus tachycardia -I suspect this is secondary to DKA and dehydration, monitor  Morbid obesity BMI 60.3 -Discussed with patient and mother about importance of diet and lifestyle modification and weight loss in managing his diabetes  DVT prophylaxis: Lovenox Code Status: Full code Family Communication: No family at bedside, called and updated mother Disposition Plan: Home likely in 1 to 2 days  Consultants:    Procedures:   Antimicrobials:    Subjective: -Did have an episode of vomiting this morning but feels a little better  now compared to yesterday  Objective: Vitals:   10/22/19 0957 10/22/19 1334 10/22/19 1357 10/22/19 1431  BP:  (!) 141/101  (!) 155/111  Pulse: (!) 114 (!) 113 (!) 116 (!) 120  Resp: (!) 29 14 (!) 24 (!) 21  Temp:    98.2 F (36.8 C)  TempSrc:    Oral  SpO2: 93% 96% 97% 99%  Weight:    (!) 213.1 kg  Height:        Intake/Output Summary (Last 24 hours) at 10/22/2019 1507 Last data filed at 10/22/2019 1115 Gross per 24 hour  Intake 1215.05 ml  Output 700 ml  Net 515.05 ml   Filed Weights   10/22/19 1431  Weight: (!) 213.1 kg    Examination:  General exam: Morbidly obese young male, sitting up in bed, AAOx3, no distress Respiratory system: Distant breath sounds  cardiovascular system: S1 & S2 heard, tachycardic  gastrointestinal system: Abdomen is obese, nondistended, soft and nontender.Normal bowel sounds heard. Central nervous system: Alert and oriented. No focal neurological deficits. Extremities: No edema Skin: No rashes, lesions or ulcers Psychiatry: Judgement and insight appear normal. Mood & affect appropriate.     Data Reviewed:   CBC: Recent Labs  Lab 10/21/19 1430 10/21/19 1531 10/21/19 1909 10/22/19 0542  WBC 17.0*  --  20.3* 20.6*  HGB 16.5 18.0* 16.6 16.1  HCT 50.8 53.0* 51.9 48.7  MCV 77.1*  --  78.5* 76.5*  PLT 442*  --  390 811   Basic Metabolic Panel: Recent Labs  Lab 10/21/19 1430 10/21/19 1531 10/21/19 1830 10/21/19 1909 10/22/19 0542 10/22/19 1209  NA 132* 131*  --  134*  136 134*  K 4.7 5.1  --  4.7 4.1 4.3  CL 95*  --   --  103 104 104  CO2 14*  --   --  13* 19* 17*  GLUCOSE 647*  --   --  392* 223* 252*  BUN 11  --   --  10 14 17   CREATININE 1.42*  --  1.21 1.24 1.63* 1.77*  CALCIUM 10.1  --   --  9.6 10.1 10.2   GFR: Estimated Creatinine Clearance: 124.6 mL/min (A) (by C-G formula based on SCr of 1.77 mg/dL (H)). Liver Function Tests: Recent Labs  Lab 10/21/19 1522  AST 44*  ALT 50*  ALKPHOS 117  BILITOT 2.0*  PROT  9.2*  ALBUMIN 4.6   Recent Labs  Lab 10/21/19 1522  LIPASE 20   No results for input(s): AMMONIA in the last 168 hours. Coagulation Profile: No results for input(s): INR, PROTIME in the last 168 hours. Cardiac Enzymes: No results for input(s): CKTOTAL, CKMB, CKMBINDEX, TROPONINI in the last 168 hours. BNP (last 3 results) No results for input(s): PROBNP in the last 8760 hours. HbA1C: Recent Labs    10/22/19 0500  HGBA1C 10.0*   CBG: Recent Labs  Lab 10/22/19 0940 10/22/19 1052 10/22/19 1208 10/22/19 1322 10/22/19 1423  GLUCAP 295* 230* 243* 235* 237*   Lipid Profile: No results for input(s): CHOL, HDL, LDLCALC, TRIG, CHOLHDL, LDLDIRECT in the last 72 hours. Thyroid Function Tests: Recent Labs    10/22/19 0542  TSH 1.390   Anemia Panel: No results for input(s): VITAMINB12, FOLATE, FERRITIN, TIBC, IRON, RETICCTPCT in the last 72 hours. Urine analysis:    Component Value Date/Time   COLORURINE STRAW (A) 10/21/2019 1455   APPEARANCEUR CLEAR 10/21/2019 1455   LABSPEC 1.030 10/21/2019 1455   PHURINE 5.0 10/21/2019 1455   GLUCOSEU >=500 (A) 10/21/2019 1455   HGBUR MODERATE (A) 10/21/2019 1455   BILIRUBINUR NEGATIVE 10/21/2019 1455   KETONESUR 80 (A) 10/21/2019 1455   PROTEINUR >=300 (A) 10/21/2019 1455   UROBILINOGEN 0.2 10/21/2019 1258   NITRITE NEGATIVE 10/21/2019 1455   LEUKOCYTESUR NEGATIVE 10/21/2019 1455   Sepsis Labs: @LABRCNTIP (procalcitonin:4,lacticidven:4)  ) Recent Results (from the past 240 hour(s))  SARS CORONAVIRUS 2 (TAT 6-24 HRS) Nasopharyngeal Nasopharyngeal Swab     Status: None   Collection Time: 10/21/19  3:18 PM   Specimen: Nasopharyngeal Swab  Result Value Ref Range Status   SARS Coronavirus 2 NEGATIVE NEGATIVE Final    Comment: (NOTE) SARS-CoV-2 target nucleic acids are NOT DETECTED. The SARS-CoV-2 RNA is generally detectable in upper and lower respiratory specimens during the acute phase of infection. Negative results do not  preclude SARS-CoV-2 infection, do not rule out co-infections with other pathogens, and should not be used as the sole basis for treatment or other patient management decisions. Negative results must be combined with clinical observations, patient history, and epidemiological information. The expected result is Negative. Fact Sheet for Patients: SugarRoll.be Fact Sheet for Healthcare Providers: https://www.woods-mathews.com/ This test is not yet approved or cleared by the [place] FDA and  has been authorized for detection and/or diagnosis of SARS-CoV-2 by FDA under an Emergency Use Authorization (EUA). This EUA will remain  in effect (meaning this test can be used) for the duration of the COVID-19 declaration under Section 56 4(b)(1) of the Act, 21 U.S.C. section 360bbb-3(b)(1), unless the authorization is terminated or revoked sooner. Performed at [facility], [place] [address] [place], [place] [address]   Culture,  blood (routine x 2)     Status: None (Preliminary result)   Collection Time: 10/21/19  7:09 PM   Specimen: BLOOD  Result Value Ref Range Status   Specimen Description BLOOD LEFT ANTECUBITAL  Final   Special Requests   Final    BOTTLES DRAWN AEROBIC AND ANAEROBIC Blood Culture results may not be optimal due to an inadequate volume of blood received in culture bottles   Culture   Final    NO GROWTH < 24 HOURS Performed at [facility], [place] [address], [place], [place] [address]    Report Status PENDING  Incomplete         Radiology Studies: CT ABDOMEN PELVIS WO CONTRAST  Result Date: 10/21/2019 CLINICAL DATA:  24 year old male with 3 days of nausea vomiting constipation urinary frequency and shortness of breath. Hyperglycemia on presentation. EXAM: CT ABDOMEN AND PELVIS WITHOUT CONTRAST TECHNIQUE: Multidetector CT imaging of the abdomen and pelvis was performed following the standard protocol without IV  contrast. COMPARISON:  Report of CT Abdomen and Pelvis 05/05/2002 (no images available). FINDINGS: Lower chest: Negative. Hepatobiliary: Hepatic steatosis with some focal fatty sparing near the gallbladder fossa. Gallbladder appears negative. No bile duct enlargement. Pancreas: Fatty atrophy, but otherwise negative. Spleen: Negative. Adrenals/Urinary Tract: Normal adrenal glands. Negative noncontrast kidneys. No nephrolithiasis or hydronephrosis. Decompressed proximal ureters. Normal distal ureters. Diminutive and unremarkable urinary bladder. Stomach/Bowel: Decompressed and negative large bowel with no significant retained stool. Normal appendix on series 3, image 70. The sigmoid is mildly redundant. Negative terminal ileum. No dilated small bowel. Decompressed and negative stomach. No free air, free fluid. Vascular/Lymphatic: Vascular patency is not evaluated in the absence of IV contrast. No calcified atherosclerosis. No lymphadenopathy. Reproductive: Negative. Other: No pelvic free fluid. Musculoskeletal: Lower lumbar disc bulging and endplate spurring. No acute osseous abnormality identified. IMPRESSION: Negative noncontrast CT Abdomen and Pelvis aside from hepatic steatosis and fatty atrophy of the pancreas. Electronically Signed   By: Genevie Ann M.D.   On: 10/21/2019 20:00   DG Abd Acute W/Chest  Result Date: 10/21/2019 CLINICAL DATA:  No pain with apparent constipation EXAM: DG ABDOMEN ACUTE W/ 1V CHEST COMPARISON:  None. FINDINGS: PA chest: Lungs are clear. Heart size and pulmonary vascularity are normal. No adenopathy. Supine and upright abdomen: There is moderate stool throughout the colon. There is no bowel dilatation or air-fluid level to suggest bowel obstruction. No free air. No abnormal calcifications. IMPRESSION: Moderate stool evident throughout the colon. No bowel dilatation or air-fluid level to suggest bowel obstruction. No free air. Lungs clear. Electronically Signed   By: Lowella Grip  III M.D.   On: 10/21/2019 15:55        Scheduled Meds: . docusate sodium  100 mg Oral BID  . enoxaparin (LOVENOX) injection  100 mg Subcutaneous Q24H   Continuous Infusions: . sodium chloride Stopped (10/22/19 0444)  . dextrose 5 % and 0.45% NaCl 75 mL/hr at 10/22/19 0225  . insulin 19 Units/hr (10/22/19 1431)     LOS: 1 day    Time spent: 44mn  PDomenic Polite MD [facility] 10/22/2019, 3:07 PM

## 2019-10-22 NOTE — ED Notes (Signed)
Left message to update mother at patient's request

## 2019-10-22 NOTE — Progress Notes (Signed)
Patient received from ED to 4E25. On monitor CCMD notified. Glucose 236. Oriented to room and call light.  Ginette Otto, RN

## 2019-10-23 DIAGNOSIS — Z6841 Body Mass Index (BMI) 40.0 and over, adult: Secondary | ICD-10-CM

## 2019-10-23 DIAGNOSIS — D72829 Elevated white blood cell count, unspecified: Secondary | ICD-10-CM

## 2019-10-23 DIAGNOSIS — I10 Essential (primary) hypertension: Secondary | ICD-10-CM

## 2019-10-23 DIAGNOSIS — N179 Acute kidney failure, unspecified: Secondary | ICD-10-CM

## 2019-10-23 LAB — BASIC METABOLIC PANEL
Anion gap: 13 (ref 5–15)
Anion gap: 12 (ref 5–15)
Anion gap: 12 (ref 5–15)
Anion gap: 11 (ref 5–15)
BUN: 15 mg/dL (ref 6–20)
BUN: 15 mg/dL (ref 6–20)
BUN: 12 mg/dL (ref 6–20)
BUN: 12 mg/dL (ref 6–20)
CO2: 18 mmol/L — ABNORMAL LOW (ref 22–32)
CO2: 17 mmol/L — ABNORMAL LOW (ref 22–32)
CO2: 19 mmol/L — ABNORMAL LOW (ref 22–32)
CO2: 20 mmol/L — ABNORMAL LOW (ref 22–32)
Calcium: 9.7 mg/dL (ref 8.9–10.3)
Calcium: 9.3 mg/dL (ref 8.9–10.3)
Calcium: 9.3 mg/dL (ref 8.9–10.3)
Calcium: 9.2 mg/dL (ref 8.9–10.3)
Chloride: 104 mmol/L (ref 98–111)
Chloride: 105 mmol/L (ref 98–111)
Chloride: 104 mmol/L (ref 98–111)
Chloride: 103 mmol/L (ref 98–111)
Creatinine, Ser: 1.48 mg/dL — ABNORMAL HIGH (ref 0.61–1.24)
Creatinine, Ser: 1.43 mg/dL — ABNORMAL HIGH (ref 0.61–1.24)
Creatinine, Ser: 1.22 mg/dL (ref 0.61–1.24)
Creatinine, Ser: 1.16 mg/dL (ref 0.61–1.24)
GFR calc Af Amer: >60 mL/min (ref >60)
GFR calc Af Amer: >60 mL/min (ref >60)
GFR calc Af Amer: >60 mL/min (ref >60)
GFR calc Af Amer: >60 mL/min (ref >60)
GFR calc non Af Amer: >60 mL/min (ref >60)
GFR calc non Af Amer: >60 mL/min (ref >60)
GFR calc non Af Amer: >60 mL/min (ref >60)
GFR calc non Af Amer: >60 mL/min (ref >60)
Glucose, Bld: 181 mg/dL — ABNORMAL HIGH (ref 70–99)
Glucose, Bld: 136 mg/dL — ABNORMAL HIGH (ref 70–99)
Glucose, Bld: 146 mg/dL — ABNORMAL HIGH (ref 70–99)
Glucose, Bld: 199 mg/dL — ABNORMAL HIGH (ref 70–99)
Potassium: 3.3 mmol/L — ABNORMAL LOW (ref 3.5–5.1)
Potassium: 4.7 mmol/L (ref 3.5–5.1)
Potassium: 3.5 mmol/L (ref 3.5–5.1)
Potassium: 3.3 mmol/L — ABNORMAL LOW (ref 3.5–5.1)
Sodium: 135 mmol/L (ref 135–145)
Sodium: 134 mmol/L — ABNORMAL LOW (ref 135–145)
Sodium: 135 mmol/L (ref 135–145)
Sodium: 134 mmol/L — ABNORMAL LOW (ref 135–145)

## 2019-10-23 LAB — GLUCOSE, CAPILLARY
Glucose-Capillary: 120 mg/dL — ABNORMAL HIGH (ref 70–99)
Glucose-Capillary: 130 mg/dL — ABNORMAL HIGH (ref 70–99)
Glucose-Capillary: 139 mg/dL — ABNORMAL HIGH (ref 70–99)
Glucose-Capillary: 140 mg/dL — ABNORMAL HIGH (ref 70–99)
Glucose-Capillary: 143 mg/dL — ABNORMAL HIGH (ref 70–99)
Glucose-Capillary: 144 mg/dL — ABNORMAL HIGH (ref 70–99)
Glucose-Capillary: 151 mg/dL — ABNORMAL HIGH (ref 70–99)
Glucose-Capillary: 152 mg/dL — ABNORMAL HIGH (ref 70–99)
Glucose-Capillary: 155 mg/dL — ABNORMAL HIGH (ref 70–99)
Glucose-Capillary: 160 mg/dL — ABNORMAL HIGH (ref 70–99)
Glucose-Capillary: 163 mg/dL — ABNORMAL HIGH (ref 70–99)
Glucose-Capillary: 173 mg/dL — ABNORMAL HIGH (ref 70–99)
Glucose-Capillary: 173 mg/dL — ABNORMAL HIGH (ref 70–99)
Glucose-Capillary: 175 mg/dL — ABNORMAL HIGH (ref 70–99)
Glucose-Capillary: 202 mg/dL — ABNORMAL HIGH (ref 70–99)
Glucose-Capillary: 215 mg/dL — ABNORMAL HIGH (ref 70–99)

## 2019-10-23 LAB — CBC
HCT: 42.3 % (ref 39.0–52.0)
Hemoglobin: 14.2 g/dL (ref 13.0–17.0)
MCH: 25.4 pg — ABNORMAL LOW (ref 26.0–34.0)
MCHC: 33.6 g/dL (ref 30.0–36.0)
MCV: 75.7 fL — ABNORMAL LOW (ref 80.0–100.0)
Platelets: 269 10*3/uL (ref 150–400)
RBC: 5.59 MIL/uL (ref 4.22–5.81)
RDW: 14.2 % (ref 11.5–15.5)
WBC: 15.4 10*3/uL — ABNORMAL HIGH (ref 4.0–10.5)
nRBC: 0 % (ref 0.0–0.2)

## 2019-10-23 LAB — BETA-HYDROXYBUTYRIC ACID: Beta-Hydroxybutyric Acid: 0.41 mmol/L — ABNORMAL HIGH (ref 0.05–0.27)

## 2019-10-23 MED ORDER — SODIUM CHLORIDE 0.9 % IV BOLUS
1000.0000 mL | Freq: Once | INTRAVENOUS | Status: AC
  Administered 2019-10-23: 1000 mL via INTRAVENOUS

## 2019-10-23 MED ORDER — SODIUM CHLORIDE 0.9 % IV BOLUS
500.0000 mL | Freq: Once | INTRAVENOUS | Status: AC
  Administered 2019-10-23: 500 mL via INTRAVENOUS

## 2019-10-23 MED ORDER — INSULIN STARTER KIT- PEN NEEDLES (ENGLISH)
1.0000 | Freq: Once | Status: AC
  Administered 2019-10-23: 1
  Filled 2019-10-23: qty 1

## 2019-10-23 MED ORDER — LIVING WELL WITH DIABETES BOOK
Freq: Once | Status: AC
  Filled 2019-10-23: qty 1

## 2019-10-23 MED ORDER — POTASSIUM CHLORIDE CRYS ER 20 MEQ PO TBCR
40.0000 meq | EXTENDED_RELEASE_TABLET | Freq: Once | ORAL | Status: AC
  Administered 2019-10-23: 40 meq via ORAL
  Filled 2019-10-23: qty 2

## 2019-10-23 MED ORDER — METOPROLOL SUCCINATE ER 25 MG PO TB24
25.0000 mg | ORAL_TABLET | Freq: Every day | ORAL | Status: DC
  Administered 2019-10-23 – 2019-10-25 (×3): 25 mg via ORAL
  Filled 2019-10-23 (×3): qty 1

## 2019-10-23 NOTE — Progress Notes (Addendum)
Inpatient Diabetes Program Recommendations  AACE/ADA: New Consensus Statement on Inpatient Glycemic Control   Target Ranges:  Prepandial:   less than 140 mg/dL      Peak postprandial:   less than 180 mg/dL (1-2 hours)      Critically ill patients:  140 - 180 mg/dL  Results for LAMBERT, JEANTY (MRN [id number]) as of 10/23/2019 07:34  Ref. Range 10/23/2019 00:54 10/23/2019 02:01 10/23/2019 04:16 10/23/2019 06:22  Glucose-Capillary Latest Ref Range: 70 - 99 mg/dL 143 (H) 151 (H) 175 (H) 160 (H)   Results for YANN, BIEHN (MRN [id number]) as of 10/23/2019 07:34  Ref. Range 10/22/2019 12:08 10/22/2019 13:22 10/22/2019 14:23 10/22/2019 15:39 10/22/2019 16:42 10/22/2019 17:36 10/22/2019 18:24 10/22/2019 19:31 10/22/2019 20:55 10/22/2019 21:42 10/22/2019 22:57 10/22/2019 23:53  Glucose-Capillary Latest Ref Range: 70 - 99 mg/dL 243 (H) 235 (H) 237 (H) 190 (H) 190 (H) 183 (H) 174 (H) 162 (H) 173 (H) 189 (H) 195 (H) 158 (H)  Results for RODGERS, LIKES (MRN [id number]) as of 10/23/2019 07:34  Ref. Range 10/23/2019 03:04  CO2 Latest Ref Range: 22 - 32 mmol/L 17 (L)  Results for DRAGON, THRUSH (MRN [id number]) as of 10/23/2019 07:34  Ref. Range 10/23/2019 03:04  Anion gap Latest Ref Range: 5 - 15  12   Review of Glycemic Control  Diabetes history: No Outpatient Diabetes medications: NA Current orders for Inpatient glycemic control: IV insulin  Inpatient Diabetes Program Recommendations:   Insulin at Transition from IV to SQ: Once acidosis resolved as determined by MD, please consider ordering Lantus 50 units BID, CBGs Q4H, Novolog 0-20 units Q4H, and Novolog 5 units TID with meals if patient eats at least 50% of meals.  NOTE: Patient admitted with DKA with new DM dx. Diabetes Coordinator spoke with patient yesterday regarding new DM dx and insulin. Patient currently has no insurance or PCP and will need assistance from [facility] for follow up and medications. If patient is able to get follow up with [facility] and  able to get medications from there, they have Humalog and Basaglar insulin pens. Patient is currently still on IV insulin drip and drip rate has ranged from 7.5 to 16 units per hour over the past 8 hours. Anticipate insulin needs will be high considering the amount of insulin patient has received since being on IV insulin.  Addendum 10/23/19@15 :00-Spoke with patient and his mother regarding new DM dx and insulin. Patient was able to recall information discussed yesterday by Diabetes Coordinator. Reviewed DM, basic skills for DM self management. Patient does not have a PCP and will need assistance with establishing care with a clinic and will need medication assistance as well (TOC consulted).  Patient has not received Living Well with DM book or insulin starter kit yet. Asked RN to check on the book and insulin starter kit. Encouraged patient to read over material once received. Discussed Carb Modified diet and patient was drinking excessive amounts of regular soda and Gatorade daily. Patient plans to cut out sugary beverages and start drinking diet soda and Gatorade Zero. Discussed reading food labels and portion control. Informed patient that RD would be consulted to provide further education on carb modified diet. Patient has a Reli-On glucometer at bedside that was provided by Inpatient Diabetes team. Informed patient that based on the amount of insulin he is requiring on the IV insulin, he will most likely need basal, meal coverage, and correction insulin as an outpatient. Discussed basal and bolus insulin. Patient states that Diabetes  Coordinator reviewed insulin administration yesterday. Had patient demonstrate how to use an insulin pen (he had saline test insulin pen and pen needles at bedside to practice with) and he was able to successfully demonstrate how to use an insulin pen. Also reviewed insulin with vial/syringe and patient was able to successfully demonstrate how to draw up and administer insulin  with vial/syringe as well. Patient will likely get medication assistance and if able to get medications from [facility], they have Basaglar and Humalog insulin pens.  Encouraged patient to check glucose at least 4 times a day and take glucometer or a glucose log with him to follow up visits. Discussed that as he makes changes with diet and exercise, he may need to decrease insulin dosages especially if he has any issues with hypoglycemia. Reviewed hypoglycemia along with treatment. Informed patient that TOC should be connecting with him to assist with follow up and medications. Informed patient that once he is transitioned to SQ insulin, RNs will be asking him to self inject insulin.  Patient and his mother verbalized understanding of information discussed and states that he has no questions at this time.   Thanks, Barnie Alderman, RN, MSN, CDE Diabetes Coordinator Inpatient Diabetes Program [phone] (Team Pager from 8am to 5pm)

## 2019-10-23 NOTE — Progress Notes (Signed)
PROGRESS NOTE    Michael [name]  VYX:[id number]  DOB: [birth year]  PCP: Patient, No Pcp Per   24 y.o. morbidly obesemale with BMI of 60,with no regular medical care presented to the Urgent care 1/12 for evaluation of periumbilical abdominal pain with constipation x3 days,  associated with nausea, and non bloody, non bilious emesis- found to have blood sugar above 500 with urine ketones of 160 and 300 protein. He was transported to the ED for further evaluation-here he was noted to have a blood glucose of 647 with diabetic ketoacidosis, acute renal failure, hyponatremia, leukocytosis, CT abdomen pelvis noted fatty liver  Subjective:  Patient ambulating in the room. He states he wouldn't mind using Insulin sq at home, once a day.Irritable when met with Diabetes educator earlier .   Objective: Vitals:   10/22/19 1657 10/22/19 1939 10/23/19 0103 10/23/19 0620  BP: (!) 144/131 (!) 143/95 126/73 112/73  Pulse: (!) 104 (!) 107    Resp: (!) 26 14 (!) 22 18  Temp: 98.2 F (36.8 C) (!) 97.5 F (36.4 C) 98.6 F (37 C) 98.9 F (37.2 C)  TempSrc: Oral Oral Oral Oral  SpO2: 100% 99% 95% 97%  Weight:      Height:        Intake/Output Summary (Last 24 hours) at 10/23/2019 0736 Last data filed at 10/23/2019 0622 Gross per 24 hour  Intake 3949.44 ml  Output 1600 ml  Net 2349.44 ml   Filed Weights   10/22/19 1431  Weight: (!) 213.1 kg    Physical Examination:  General exam: Appears calm and comfortable  Respiratory system: Clear to auscultation. Respiratory effort normal. Cardiovascular system: S1 & S2 heard, RRR. No JVD, murmurs, rubs, gallops or clicks. No pedal edema. Gastrointestinal system: Abdomen is nondistended, soft and nontender. Normal bowel sounds heard. Central nervous system: Alert and oriented. No new focal neurological deficits. Extremities: No contractures, edema or joint deformities.  Skin: No rashes, lesions or ulcers Psychiatry: Judgement and insight appear normal.  Mood & affect appropriate.   Data Reviewed: I have personally reviewed following labs and imaging studies  CBC: Recent Labs  Lab 10/21/19 1430 10/21/19 1531 10/21/19 1909 10/22/19 0542 10/23/19 0304  WBC 17.0*  --  20.3* 20.6* 15.4*  HGB 16.5 18.0* 16.6 16.1 14.2  HCT 50.8 53.0* 51.9 48.7 42.3  MCV 77.1*  --  78.5* 76.5* 75.7*  PLT 442*  --  390 335 848   Basic Metabolic Panel: Recent Labs  Lab 10/22/19 1209 10/22/19 1515 10/22/19 1943 10/22/19 2328 10/23/19 0304  NA 134* 136 134* 135 134*  K 4.3 4.6 3.7 3.3* 4.7  CL 104 107 104 104 105  CO2 17* 15* 17* 18* 17*  GLUCOSE 252* 211* 185* 181* 136*  BUN 17 20 16 15 15   CREATININE 1.77* 2.36* 1.64* 1.48* 1.43*  CALCIUM 10.2 10.3 10.0 9.7 9.3   GFR: Estimated Creatinine Clearance: 154.3 mL/min (A) (by C-G formula based on SCr of 1.43 mg/dL (H)). Liver Function Tests: Recent Labs  Lab 10/21/19 1522  AST 44*  ALT 50*  ALKPHOS 117  BILITOT 2.0*  PROT 9.2*  ALBUMIN 4.6   Recent Labs  Lab 10/21/19 1522  LIPASE 20   No results for input(s): AMMONIA in the last 168 hours. Coagulation Profile: No results for input(s): INR, PROTIME in the last 168 hours. Cardiac Enzymes: No results for input(s): CKTOTAL, CKMB, CKMBINDEX, TROPONINI in the last 168 hours. BNP (last 3 results) No results for input(s): PROBNP in the  last 8760 hours. HbA1C: Recent Labs    10/22/19 0500  HGBA1C 10.0*   CBG: Recent Labs  Lab 10/22/19 2353 10/23/19 0054 10/23/19 0201 10/23/19 0416 10/23/19 0622  GLUCAP 158* 143* 151* 175* 160*   Lipid Profile: No results for input(s): CHOL, HDL, LDLCALC, TRIG, CHOLHDL, LDLDIRECT in the last 72 hours. Thyroid Function Tests: Recent Labs    10/22/19 0542  TSH 1.390   Anemia Panel: No results for input(s): VITAMINB12, FOLATE, FERRITIN, TIBC, IRON, RETICCTPCT in the last 72 hours. Sepsis Labs: Recent Labs  Lab 10/21/19 1430 10/21/19 2042  LATICACIDVEN 3.6* 2.1*    Recent Results  (from the past 240 hour(s))  SARS CORONAVIRUS 2 (TAT 6-24 HRS) Nasopharyngeal Nasopharyngeal Swab     Status: None   Collection Time: 10/21/19  3:18 PM   Specimen: Nasopharyngeal Swab  Result Value Ref Range Status   SARS Coronavirus 2 NEGATIVE NEGATIVE Final    Comment: (NOTE) SARS-CoV-2 target nucleic acids are NOT DETECTED. The SARS-CoV-2 RNA is generally detectable in upper and lower respiratory specimens during the acute phase of infection. Negative results do not preclude SARS-CoV-2 infection, do not rule out co-infections with other pathogens, and should not be used as the sole basis for treatment or other patient management decisions. Negative results must be combined with clinical observations, patient history, and epidemiological information. The expected result is Negative. Fact Sheet for Patients: SugarRoll.be Fact Sheet for Healthcare Providers: https://www.woods-mathews.com/ This test is not yet approved or cleared by the [place] FDA and  has been authorized for detection and/or diagnosis of SARS-CoV-2 by FDA under an Emergency Use Authorization (EUA). This EUA will remain  in effect (meaning this test can be used) for the duration of the COVID-19 declaration under Section 56 4(b)(1) of the Act, 21 U.S.C. section 360bbb-3(b)(1), unless the authorization is terminated or revoked sooner. Performed at [facility], [place] [address], [place], [place] [address]   Culture, blood (routine x 2)     Status: None (Preliminary result)   Collection Time: 10/21/19  7:09 PM   Specimen: BLOOD  Result Value Ref Range Status   Specimen Description BLOOD LEFT ANTECUBITAL  Final   Special Requests   Final    BOTTLES DRAWN AEROBIC AND ANAEROBIC Blood Culture results may not be optimal due to an inadequate volume of blood received in culture bottles   Culture   Final    NO GROWTH < 24 HOURS Performed at [facility], [place]  [address], [place], [place] [address]    Report Status PENDING  Incomplete      Radiology Studies: CT ABDOMEN PELVIS WO CONTRAST  Result Date: 10/21/2019 CLINICAL DATA:  24 year old male with 3 days of nausea vomiting constipation urinary frequency and shortness of breath. Hyperglycemia on presentation. EXAM: CT ABDOMEN AND PELVIS WITHOUT CONTRAST TECHNIQUE: Multidetector CT imaging of the abdomen and pelvis was performed following the standard protocol without IV contrast. COMPARISON:  Report of CT Abdomen and Pelvis 05/05/2002 (no images available). FINDINGS: Lower chest: Negative. Hepatobiliary: Hepatic steatosis with some focal fatty sparing near the gallbladder fossa. Gallbladder appears negative. No bile duct enlargement. Pancreas: Fatty atrophy, but otherwise negative. Spleen: Negative. Adrenals/Urinary Tract: Normal adrenal glands. Negative noncontrast kidneys. No nephrolithiasis or hydronephrosis. Decompressed proximal ureters. Normal distal ureters. Diminutive and unremarkable urinary bladder. Stomach/Bowel: Decompressed and negative large bowel with no significant retained stool. Normal appendix on series 3, image 70. The sigmoid is mildly redundant. Negative terminal ileum. No dilated small bowel.  Decompressed and negative stomach. No free air, free fluid. Vascular/Lymphatic: Vascular patency is not evaluated in the absence of IV contrast. No calcified atherosclerosis. No lymphadenopathy. Reproductive: Negative. Other: No pelvic free fluid. Musculoskeletal: Lower lumbar disc bulging and endplate spurring. No acute osseous abnormality identified. IMPRESSION: Negative noncontrast CT Abdomen and Pelvis aside from hepatic steatosis and fatty atrophy of the pancreas. Electronically Signed   By: Genevie Ann M.D.   On: 10/21/2019 20:00   US RENAL  Result Date: 10/22/2019 CLINICAL DATA:  Acute kidney injury EXAM: RENAL / URINARY TRACT ULTRASOUND COMPLETE COMPARISON:  CT abdomen pelvis 10/21/2019 FINDINGS: Right  Kidney: Renal measurements: 10.10 x 4.4 x 4.2 cm = volume: 98.7 mL . Echogenicity within normal limits. No mass or hydronephrosis visualized. Left Kidney: Renal measurements: 12.2 x 6.4 x 4.9 cm = volume: 200.3 mL. Echogenicity within normal limits. No mass or hydronephrosis visualized. Bladder: Appears normal for degree of bladder distention. Other: Technically difficult exam due to patient body habitus which limited availability of sonographic windowing. IMPRESSION: Asymmetry of the renal size, likely related to technical factors given asymmetric appearance on comparison CT from 1 day prior. Study was technically difficult due to patient body habitus limiting sonographic window availability. No acute renal abnormality is seen. Electronically Signed   By: Lovena Le M.D.   On: 10/22/2019 23:13   DG Abd Acute W/Chest  Result Date: 10/21/2019 CLINICAL DATA:  No pain with apparent constipation EXAM: DG ABDOMEN ACUTE W/ 1V CHEST COMPARISON:  None. FINDINGS: PA chest: Lungs are clear. Heart size and pulmonary vascularity are normal. No adenopathy. Supine and upright abdomen: There is moderate stool throughout the colon. There is no bowel dilatation or air-fluid level to suggest bowel obstruction. No free air. No abnormal calcifications. IMPRESSION: Moderate stool evident throughout the colon. No bowel dilatation or air-fluid level to suggest bowel obstruction. No free air. Lungs clear. Electronically Signed   By: Lowella Grip III M.D.   On: 10/21/2019 15:55        Scheduled Meds: . docusate sodium  100 mg Oral BID  . enoxaparin (LOVENOX) injection  100 mg Subcutaneous Q24H   Continuous Infusions: . sodium chloride Stopped (10/22/19 0444)  . dextrose 5 % and 0.45% NaCl 125 mL/hr at 10/23/19 0618  . insulin 10.5 Units/hr (10/23/19 9390)    Assessment & Plan:   Diabetic ketoacidosis, New diagnosis of diabetes mellitus: Remains on insulin drip, fluids changed to dextrose fluids as BG now <200.  Start diabetic diet and plan to transition to sq inslin once acidosis resolves. Check beta hydroxybutyrate level to ensure persistent acidosis due to DKA.Hemoglobin A1c is 10.0. Can likely transition to oral hypoglycemics if compliance with sq insulin questionable.-Diabetes coordinator consult-Dietitian consulted  Acute kidney injury ; Creat on admission 1.7->2.36->1.7->now plateau at 1.4. 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 This could be his new baseline creatinine. Monitor with fluids for now. May benefit from ACEI at some point  Leukocytosis-Likely reactive from n/v, and from hemoconcentration, COVID-19 PCR is negative. CT abd/pelvis benign, afebrile, down trending today-monitor  Sinus tachycardia- secondary to DKA and dehydration, improving.   HTN: BP elevated on admission at 174/125. Appears to be slowly improving. Can consider ACEI once creatinine stabilizes. Will add low dose b-blocker/Norvasc for now   Morbid obesity with BMI 60.3-Patient understands importance of diet and lifestyle modification and weight loss in managing his diabetes. Compliance is questionable   DVT prophylaxis: Lovenox Code Status: Full Family / Patient Communication: d/w patient  Disposition Plan: home medically clear and transitioned to SQ home regimen. BMP this afternoon.      LOS: 2 days    Time spent: 66 min    Guilford Shi, MD [facility] Pager [phone]  If 7PM-7AM, please contact night-coverage [email or web address] Password [email or web address] 10/23/2019, 7:36 AM

## 2019-10-23 NOTE — Plan of Care (Signed)
  Problem: Education: Goal: Ability to describe self-care measures that may prevent or decrease complications (Diabetes Survival Skills Education) will improve Outcome: Progressing   Problem: Education: Goal: Individualized Educational Video(s) Outcome: Progressing   

## 2019-10-24 LAB — GLUCOSE, CAPILLARY
Glucose-Capillary: 118 mg/dL — ABNORMAL HIGH (ref 70–99)
Glucose-Capillary: 135 mg/dL — ABNORMAL HIGH (ref 70–99)
Glucose-Capillary: 150 mg/dL — ABNORMAL HIGH (ref 70–99)
Glucose-Capillary: 153 mg/dL — ABNORMAL HIGH (ref 70–99)
Glucose-Capillary: 153 mg/dL — ABNORMAL HIGH (ref 70–99)
Glucose-Capillary: 160 mg/dL — ABNORMAL HIGH (ref 70–99)
Glucose-Capillary: 166 mg/dL — ABNORMAL HIGH (ref 70–99)
Glucose-Capillary: 170 mg/dL — ABNORMAL HIGH (ref 70–99)
Glucose-Capillary: 173 mg/dL — ABNORMAL HIGH (ref 70–99)
Glucose-Capillary: 177 mg/dL — ABNORMAL HIGH (ref 70–99)
Glucose-Capillary: 186 mg/dL — ABNORMAL HIGH (ref 70–99)
Glucose-Capillary: 190 mg/dL — ABNORMAL HIGH (ref 70–99)
Glucose-Capillary: 196 mg/dL — ABNORMAL HIGH (ref 70–99)
Glucose-Capillary: 210 mg/dL — ABNORMAL HIGH (ref 70–99)

## 2019-10-24 LAB — BASIC METABOLIC PANEL
Anion gap: 11 (ref 5–15)
BUN: 8 mg/dL (ref 6–20)
CO2: 22 mmol/L (ref 22–32)
Calcium: 9 mg/dL (ref 8.9–10.3)
Chloride: 104 mmol/L (ref 98–111)
Creatinine, Ser: 1.11 mg/dL (ref 0.61–1.24)
GFR calc Af Amer: >60 mL/min (ref >60)
GFR calc non Af Amer: >60 mL/min (ref >60)
Glucose, Bld: 145 mg/dL — ABNORMAL HIGH (ref 70–99)
Potassium: 3 mmol/L — ABNORMAL LOW (ref 3.5–5.1)
Sodium: 137 mmol/L (ref 135–145)

## 2019-10-24 MED ORDER — POTASSIUM CHLORIDE 10 MEQ/100ML IV SOLN
10.0000 meq | INTRAVENOUS | Status: AC
  Administered 2019-10-24 (×3): 10 meq via INTRAVENOUS
  Filled 2019-10-24 (×3): qty 100

## 2019-10-24 MED ORDER — INSULIN ASPART 100 UNIT/ML ~~LOC~~ SOLN: 0.0000 [IU] | Freq: Every day | SUBCUTANEOUS | Status: DC

## 2019-10-24 MED ORDER — INSULIN ASPART 100 UNIT/ML ~~LOC~~ SOLN
0.0000 [IU] | Freq: Three times a day (TID) | SUBCUTANEOUS | Status: DC
  Administered 2019-10-25: 11 [IU] via SUBCUTANEOUS
  Administered 2019-10-25: 15 [IU] via SUBCUTANEOUS
  Administered 2019-10-25: 11 [IU] via SUBCUTANEOUS

## 2019-10-24 MED ORDER — LISINOPRIL 10 MG PO TABS
10.0000 mg | ORAL_TABLET | Freq: Every day | ORAL | Status: DC
  Administered 2019-10-24 – 2019-10-25 (×2): 10 mg via ORAL
  Filled 2019-10-24 (×2): qty 1

## 2019-10-24 MED ORDER — INSULIN GLARGINE 100 UNIT/ML ~~LOC~~ SOLN
25.0000 [IU] | Freq: Every day | SUBCUTANEOUS | Status: DC
  Administered 2019-10-24: 25 [IU] via SUBCUTANEOUS
  Filled 2019-10-24 (×3): qty 0.25

## 2019-10-24 NOTE — Discharge Instructions (Signed)

## 2019-10-24 NOTE — Progress Notes (Addendum)
[facility]  PROGRESS NOTE  Michael [name] MLY:[id number] DOB: Aug 04, 1996 DOA: 10/21/2019 PCP: Patient, No Pcp Per   Brief HPI:   24 year old morbidly obese male with BMI of 60 with regular medical care came to urgent care on 10/21/2019 for evaluation of periumbilical abdominal pain with constipation x3 days.  He was found to have blood glucose of 500 with urine ketones of 160 and 300 protein.  He was sent to ED for further evaluation.  Was found to have blood glucose of 647 and diabetic ketoacidosis.  Acute kidney injury, hyponatremia, leukocytosis.  CT abdomen pelvis showed fatty liver.    Subjective   Patient seen and examined, denies any complaints.  Still on IV insulin.   Assessment/Plan:     1. Diabetic ketoacidosis-new diagnosis of diabetes mellitus.  Continue IV insulin.  Change fluids to D5W as blood glucose is less than 200.  Anion gap is closed.  Will discontinue IV insulin.  Start Lantus 25 units subcu daily, resistant sliding scale.  Carb modified diet.  Hemoglobin A1c is 10.0.  Diabetes coordinator consulted.  2. Acute kidney injury-creatinine on admission 1.7, increased to 2.  3 6, now back to 1.  1 1.  At baseline.  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  Will start him on lisinopril 10 mg p.o. daily.  3. Hypertension-blood pressure was elevated, started on metoprolol, added lisinopril as above.  4. Hypokalemia-potassium is 3.0, will replace potassium and follow BMP in a.m.  5. Morbid  obesity-noted, patient was counseled for losing weight     SpO2: 99 %   COVID-19 Labs  Recent Labs    10/21/19 1830  DDIMER 0.31    Lab Results  Component Value Date   SARSCOV2NAA NEGATIVE 10/21/2019     CBG: Recent Labs  Lab 10/24/19 0608 10/24/19 0719 10/24/19 0921 10/24/19 1146 10/24/19 1218  GLUCAP 153* 166* 153* 196* 177*    CBC: Recent Labs  Lab 10/21/19 1430 10/21/19 1531 10/21/19 1909 10/22/19 0542 10/23/19 0304  WBC 17.0*  --  20.3*  20.6* 15.4*  HGB 16.5 18.0* 16.6 16.1 14.2  HCT 50.8 53.0* 51.9 48.7 42.3  MCV 77.1*  --  78.5* 76.5* 75.7*  PLT 442*  --  390 335 812    Basic Metabolic Panel: Recent Labs  Lab 10/22/19 2328 10/23/19 0304 10/23/19 1456 10/23/19 1809 10/24/19 0416  NA 135 134* 135 134* 137  K 3.3* 4.7 3.5 3.3* 3.0*  CL 104 105 104 103 104  CO2 18* 17* 19* 20* 22  GLUCOSE 181* 136* 146* 199* 145*  BUN 15 15 12 12 8   CREATININE 1.48* 1.43* 1.22 1.16 1.11  CALCIUM 9.7 9.3 9.3 9.2 9.0     Liver Function Tests: Recent Labs  Lab 10/21/19 1522  AST 44*  ALT 50*  ALKPHOS 117  BILITOT 2.0*  PROT 9.2*  ALBUMIN 4.6        DVT prophylaxis: Lovenox  Code Status: Full code  Family Communication: No family at bedside  Disposition Plan: likely home when medically ready for discharge      Scheduled medications:  . docusate sodium  100 mg Oral BID  . enoxaparin (LOVENOX) injection  100 mg Subcutaneous Q24H  . metoprolol succinate  25 mg Oral Daily    Consultants:    Procedures:    Antibiotics:   Anti-infectives (From admission, onward)   Start     Dose/Rate Route Frequency Ordered Stop   10/22/19 0030  piperacillin-tazobactam (ZOSYN) IVPB 3.375 g  Status:  Discontinued     3.375 g 12.5 mL/hr over 240 Minutes Intravenous Every 8 hours 10/21/19 1814 10/22/19 0806   10/21/19 1815  piperacillin-tazobactam (ZOSYN) IVPB 3.375 g     3.375 g 100 mL/hr over 30 Minutes Intravenous  Once 10/21/19 1814 10/21/19 2132       Objective   Vitals:   10/24/19 0500 10/24/19 0811 10/24/19 0934 10/24/19 1148  BP:  117/67    Pulse:  98 94 96  Resp: (!) 22 18  17   Temp:  97.9 F (36.6 C)  97.8 F (36.6 C)  TempSrc:  Oral  Oral  SpO2:  100%  99%  Weight:      Height:        Intake/Output Summary (Last 24 hours) at 10/24/2019 1430 Last data filed at 10/24/2019 0900 Gross per 24 hour  Intake 2512.69 ml  Output 1450 ml  Net 1062.69 ml    01/13 1901 - 01/15 0700 In: 6127.1  [I.V.:5627.1] Out: 2425 [Urine:2425]  Filed Weights   10/22/19 1431  Weight: (!) 213.1 kg    Physical Examination:    General-appears in no acute distress  Heart-S1-S2, regular, no murmur auscultated  Lungs-clear to auscultation bilaterally, no wheezing or crackles auscultated  Abdomen-soft, nontender, no organomegaly  Extremities-no edema in the lower extremities  Neuro-alert, oriented x3, no focal deficit noted    Data Reviewed:   Recent Results (from the past 240 hour(s))  SARS CORONAVIRUS 2 (TAT 6-24 HRS) Nasopharyngeal Nasopharyngeal Swab     Status: None   Collection Time: 10/21/19  3:18 PM   Specimen: Nasopharyngeal Swab  Result Value Ref Range Status   SARS Coronavirus 2 NEGATIVE NEGATIVE Final    Comment: (NOTE) SARS-CoV-2 target nucleic acids are NOT DETECTED. The SARS-CoV-2 RNA is generally detectable in upper and lower respiratory specimens during the acute phase of infection. Negative results do not preclude SARS-CoV-2 infection, do not rule out co-infections with other pathogens, and should not be used as the sole basis for treatment or other patient management decisions. Negative results must be combined with clinical observations, patient history, and epidemiological information. The expected result is Negative. Fact Sheet for Patients: 12/19/19 Fact Sheet for Healthcare Providers: HairSlick.no This test is not yet approved or cleared by the [place] FDA and  has been authorized for detection and/or diagnosis of SARS-CoV-2 by FDA under an Emergency Use Authorization (EUA). This EUA will remain  in effect (meaning this test can be used) for the duration of the COVID-19 declaration under Section 56 4(b)(1) of the Act, 21 U.S.C. section 360bbb-3(b)(1), unless the authorization is terminated or revoked sooner. Performed at [facility], [address] [place],  [place]   Culture, blood (routine x 2)     Status: None (Preliminary result)   Collection Time: 10/21/19  7:09 PM   Specimen: BLOOD  Result Value Ref Range Status   Specimen Description BLOOD LEFT ANTECUBITAL  Final   Special Requests   Final    BOTTLES DRAWN AEROBIC AND ANAEROBIC Blood Culture results may not be optimal due to an inadequate volume of blood received in culture bottles   Culture   Final    NO GROWTH 2 DAYS Performed at [facility], [address], [place], [place]    Report Status PENDING  Incomplete  Culture, blood (routine x 2)     Status: None (Preliminary result)   Collection Time: 10/22/19  5:49 AM   Specimen:  BLOOD LEFT HAND  Result Value Ref Range Status   Specimen Description BLOOD LEFT HAND  Final   Special Requests   Final    BOTTLES DRAWN AEROBIC ONLY Blood Culture adequate volume   Culture   Final    NO GROWTH 1 DAY Performed at [facility], [address] [place], [place] [address]    Report Status PENDING  Incomplete    Recent Labs  Lab 10/21/19 1522  LIPASE 20   No results for input(s): AMMONIA in the last 168 hours.  Cardiac Enzymes: No results for input(s): CKTOTAL, CKMB, CKMBINDEX, TROPONINI in the last 168 hours. BNP (last 3 results) Recent Labs    2019-10-30 0542  BNP 16.9     Studies:  US RENAL  Result Date: 2019/10/30 CLINICAL DATA:  Acute kidney injury EXAM: RENAL / URINARY TRACT ULTRASOUND COMPLETE COMPARISON:  CT abdomen pelvis 10/21/2019 FINDINGS: Right Kidney: Renal measurements: 10.10 x 4.4 x 4.2 cm = volume: 98.7 mL . Echogenicity within normal limits. No mass or hydronephrosis visualized. Left Kidney: Renal measurements: 12.2 x 6.4 x 4.9 cm = volume: 200.3 mL. Echogenicity within normal limits. No mass or hydronephrosis visualized. Bladder: Appears normal for degree of bladder distention. Other: Technically difficult exam due to patient body habitus which limited availability of sonographic  windowing. IMPRESSION: Asymmetry of the renal size, likely related to technical factors given asymmetric appearance on comparison CT from 1 day prior. Study was technically difficult due to patient body habitus limiting sonographic window availability. No acute renal abnormality is seen. Electronically Signed   By: Kreg Shropshire M.D.   On: 10/30/2019 23:13     Admission status: Inpatient: Based on patients clinical presentation and evaluation of above clinical data, I have made determination that patient meets Inpatient criteria at this time.   Meredeth Ide   [facility] If 7PM-7AM, please contact night-coverage at [email or web address], Office  [phone]  password [email or web address]  10/24/2019, 2:30 PM  LOS: 3 days

## 2019-10-24 NOTE — Progress Notes (Signed)
Initial Nutrition Assessment  DOCUMENTATION CODES:   Morbid obesity  INTERVENTION:   Recommend liberalizing diet to carbohydrate modified only.  RD will order supplements if PO intake does not improve.   Provided education on carbohydrate counting, impact of sugar/carbs on blood sugar, and methods to reduce A1c   NUTRITION DIAGNOSIS:   Inadequate oral intake related to poor appetite as evidenced by meal completion < 25%.   GOAL:   Patient will meet greater than or equal to 90% of their needs   MONITOR:   PO intake, Labs, Weight trends, Supplement acceptance  REASON FOR ASSESSMENT:   Consult Diet education  ASSESSMENT:   Pt with a PMH significant for morbid obesity who presented to urgent care C/O abdominal pain, constipation, N/V x3 days. Pt was found to have blood sugar >500 and was transported to ED for evaluation. Pt admitted with DKA (new DM), ARF, hyponatremia, leukocytosis and CT noted fatty liver.   Pt's mother on FaceTime at time of RD visit. Pt's mother participated in interview and education. Pt reports appetite is typically good, but has had a poor appetite since 3 days PTA. Pt's mother reports that for the 3 days PTA, the pt was only consuming liquids due to his complaints of abdominal pain. Pt reports appetite remains poor, but has slightly improved. 0% PO intake documented. Observed breakfast tray, pt consumed <25%. Pt reports his home diet is as follows: either has a strawberry/mango smoothie from [employer] for breakfast or he has eggs, [place] sausage, and grits; for lunch, he typically has a turkey/ham sandwich on honey wheat bread with a side of chips; his dinners vary, but he will either eat chicken wings and fries from a restaurant or he will eat a meal cooked by his mother, typically air fried fish with a side Caesar salad and a starch (such as potatoes). Pt states he typically drinks ~1.5L of water per day and has 1-2 regular sodas and 1-2 regular  Gatorades every other day.  Discussed impact of carbohydrates/sugar sweetened beverages on blood sugar with pt. Also educated pt on carbohydrate counting and methods to lower A1c. Pt and his mother expressed understanding; discussed meal options and beverage substitutes together.   Pt states he does not weigh himself at home, but has not noticed any changes to the way his clothing fits. He states his wt at urgent care was ~470 lbs.   Medications reviewed and include: Colace, Myxredlin 9 units/hour  Labs reviewed: K+ 3.0 (L), CBG 118-196  UOP: 1,74mL x24 hours I/O: +3,637.78mL since admit   NUTRITION - FOCUSED PHYSICAL EXAM:    Most Recent Value  Orbital Region  No depletion  Upper Arm Region  No depletion  Thoracic and Lumbar Region  No depletion  Buccal Region  No depletion  Temple Region  No depletion  Clavicle Bone Region  No depletion  Clavicle and Acromion Bone Region  No depletion  Scapular Bone Region  No depletion  Dorsal Hand  No depletion  Patellar Region  No depletion  Anterior Thigh Region  No depletion  Posterior Calf Region  No depletion  Edema (RD Assessment)  None  Hair  Reviewed  Eyes  Reviewed  Mouth  Reviewed  Skin  Reviewed  Nails  Reviewed       Diet Order:   Diet Order            Diet heart healthy/carb modified Room service appropriate? Yes; Fluid consistency: Thin  Diet effective now  EDUCATION NEEDS:   Education needs have been addressed  Skin:  Skin Assessment: Reviewed RN Assessment  Last BM:  10/23/19  Height:   Ht Readings from Last 1 Encounters:  10/21/19 6\' 2"  (1.88 m)    Weight:   Wt Readings from Last 3 Encounters:  10/22/19 (!) 213.1 kg  10/21/19 (!) 213.2 kg  08/12/12 135.3 kg (>99 %, Z= 3.60)*   * Growth percentiles are based on CDC (Boys, 2-20 Years) data.     Ideal Body Weight:  86.36 kg  BMI:  Body mass index is 60.32 kg/m.  Estimated Nutritional Needs:   Kcal:  2400-2600  Protein:   145-160 grams  Fluid:  >2.4L   13/04/13, MS, RD, LDN Pager: [phone] Weekend/After Hours Pager: [phone]

## 2019-10-25 LAB — BASIC METABOLIC PANEL
Anion gap: 13 (ref 5–15)
Anion gap: 12 (ref 5–15)
BUN: 6 mg/dL (ref 6–20)
BUN: 7 mg/dL (ref 6–20)
CO2: 17 mmol/L — ABNORMAL LOW (ref 22–32)
CO2: 18 mmol/L — ABNORMAL LOW (ref 22–32)
Calcium: 8.9 mg/dL (ref 8.9–10.3)
Calcium: 9.1 mg/dL (ref 8.9–10.3)
Chloride: 106 mmol/L (ref 98–111)
Chloride: 107 mmol/L (ref 98–111)
Creatinine, Ser: 0.79 mg/dL (ref 0.61–1.24)
Creatinine, Ser: 0.88 mg/dL (ref 0.61–1.24)
GFR calc Af Amer: >60 mL/min (ref >60)
GFR calc Af Amer: >60 mL/min (ref >60)
GFR calc non Af Amer: >60 mL/min (ref >60)
GFR calc non Af Amer: >60 mL/min (ref >60)
Glucose, Bld: 220 mg/dL — ABNORMAL HIGH (ref 70–99)
Glucose, Bld: 314 mg/dL — ABNORMAL HIGH (ref 70–99)
Potassium: 3.7 mmol/L (ref 3.5–5.1)
Potassium: 3.8 mmol/L (ref 3.5–5.1)
Sodium: 136 mmol/L (ref 135–145)
Sodium: 137 mmol/L (ref 135–145)

## 2019-10-25 LAB — GLUCOSE, CAPILLARY
Glucose-Capillary: 265 mg/dL — ABNORMAL HIGH (ref 70–99)
Glucose-Capillary: 331 mg/dL — ABNORMAL HIGH (ref 70–99)
Glucose-Capillary: 340 mg/dL — ABNORMAL HIGH (ref 70–99)

## 2019-10-25 MED ORDER — INSULIN GLARGINE 100 UNIT/ML ~~LOC~~ SOLN
30.0000 [IU] | Freq: Every day | SUBCUTANEOUS | Status: DC
  Administered 2019-10-25: 30 [IU] via SUBCUTANEOUS
  Filled 2019-10-25 (×2): qty 0.3

## 2019-10-25 MED ORDER — METFORMIN HCL 500 MG PO TABS: 500.0000 mg | ORAL_TABLET | Freq: Two times a day (BID) | ORAL | 11 refills | Status: DC

## 2019-10-25 MED ORDER — LISINOPRIL 10 MG PO TABS: 10.0000 mg | ORAL_TABLET | Freq: Every day | ORAL | 2 refills | Status: DC

## 2019-10-25 MED ORDER — INSULIN GLARGINE 100 UNIT/ML ~~LOC~~ SOLN: 30.0000 [IU] | Freq: Every day | SUBCUTANEOUS | 11 refills | Status: DC

## 2019-10-25 MED ORDER — "INSULIN SYRINGE 31G X 5/16"" 0.5 ML MISC": 0 refills | Status: DC

## 2019-10-25 MED ORDER — METOPROLOL TARTRATE 25 MG PO TABS: 12.5000 mg | ORAL_TABLET | Freq: Two times a day (BID) | ORAL | 2 refills | Status: DC

## 2019-10-25 MED ORDER — INSULIN GLARGINE 100 UNIT/ML ~~LOC~~ SOLN: 30.0000 [IU] | Freq: Every day | SUBCUTANEOUS | Status: DC

## 2019-10-25 MED ORDER — BLOOD GLUCOSE METER KIT: PACK | 0 refills | Status: AC

## 2019-10-25 NOTE — Plan of Care (Signed)
  Problem: Education: Goal: Ability to describe self-care measures that may prevent or decrease complications (Diabetes Survival Skills Education) will improve Outcome: Completed/Met Goal: Individualized Educational Video(s) Outcome: Completed/Met   Problem: Coping: Goal: Ability to adjust to condition or change in health will improve Outcome: Completed/Met   Problem: Fluid Volume: Goal: Ability to maintain a balanced intake and output will improve Outcome: Completed/Met   Problem: Health Behavior/Discharge Planning: Goal: Ability to identify and utilize available resources and services will improve Outcome: Completed/Met Goal: Ability to manage health-related needs will improve Outcome: Completed/Met   Problem: Metabolic: Goal: Ability to maintain appropriate glucose levels will improve Outcome: Completed/Met   Problem: Nutritional: Goal: Maintenance of adequate nutrition will improve Outcome: Completed/Met Goal: Progress toward achieving an optimal weight will improve Outcome: Completed/Met   Problem: Tissue Perfusion: Goal: Adequacy of tissue perfusion will improve Outcome: Completed/Met   Problem: Education: Goal: Knowledge of General Education information will improve Description: Including pain rating scale, medication(s)/side effects and non-pharmacologic comfort measures Outcome: Completed/Met   Problem: Health Behavior/Discharge Planning: Goal: Ability to manage health-related needs will improve Outcome: Completed/Met   Problem: Clinical Measurements: Goal: Ability to maintain clinical measurements within normal limits will improve Outcome: Completed/Met Goal: Will remain free from infection Outcome: Completed/Met Goal: Diagnostic test results will improve Outcome: Completed/Met Goal: Respiratory complications will improve Outcome: Completed/Met Goal: Cardiovascular complication will be avoided Outcome: Completed/Met   Problem: Activity: Goal: Risk for  activity intolerance will decrease Outcome: Completed/Met   Problem: Nutrition: Goal: Adequate nutrition will be maintained Outcome: Completed/Met   Problem: Coping: Goal: Level of anxiety will decrease Outcome: Completed/Met   Problem: Elimination: Goal: Will not experience complications related to bowel motility Outcome: Completed/Met Goal: Will not experience complications related to urinary retention Outcome: Completed/Met   Problem: Pain Managment: Goal: General experience of comfort will improve Outcome: Completed/Met   Problem: Safety: Goal: Ability to remain free from injury will improve Outcome: Completed/Met   Problem: Skin Integrity: Goal: Risk for impaired skin integrity will decrease Outcome: Completed/Met

## 2019-10-25 NOTE — Progress Notes (Signed)
Pt's heart monitor keeps running asystole, pt is lying down in a prone position and refuses to turn on his back or adjust himself in bed. Heart monitor is on stand by for now.

## 2019-10-25 NOTE — Discharge Summary (Signed)
Physician Discharge Summary  Michael [name] XYV:[id number] DOB: Mar 01, 1996 DOA: 10/21/2019  PCP: Patient, No Pcp Per  Admit date: 10/21/2019 Discharge date: 10/25/2019  Time spent: 50 minutes  Recommendations for Outpatient Follow-up:  1. Follow-up PCP in 2 weeks  Discharge Diagnoses:  Active Problems:   DKA (diabetic ketoacidoses) ([place])   Discharge Condition: Stable  Diet recommendation: Carb modified diet  Filed Weights   10/22/19 1431  Weight: (!) 213.1 kg    History of present illness:  24 year old morbidly obese male with BMI of 60 with regular medical care came to urgent care on 10/21/2019 for evaluation of periumbilical abdominal pain with constipation x3 days.  He was found to have blood glucose of 500 with urine ketones of 160 and 300 protein.  He was sent to ED for further evaluation.  Was found to have blood glucose of 647 and diabetic ketoacidosis.  Acute kidney injury, hyponatremia, leukocytosis.  CT abdomen pelvis showed fatty liver.   Hospital Course:   1. Diabetic ketoacidosis/new onset diabetes mellitus-resolved, new diagnosis of diabetes mellitus.    Patient was started on IV insulin, anion gap closed.  Blood glucose is stable.  Initially started on Lantus 25 units subcu daily.  Blood glucose was still elevated so Lantus changed to 30 units subcu daily.  Hemoglobin A1c is 10.0.  Will discharge on Metformin 500 mg p.o. twice daily.  Patient will be given prescription for lancets, glucometer, test strips.  2. Acute kidney injury-creatinine on admission 1.7, increased to 2.36, now back to 1.11 At baseline.  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  Will start him on lisinopril 10 mg p.o. daily.  3. Hypertension-blood pressure was elevated, started on metoprolol, added lisinopril as above.  4. Hypokalemia-replete  5. Morbid  obesity-noted, patient was counseled for losing weight   Procedures:    Consultations:    Discharge Exam: Vitals:    10/25/19 0646 10/25/19 1158  BP: 135/81 (!) 152/85  Pulse:  90  Resp:  19  Temp: 97.6 F (36.4 C) 98 F (36.7 C)  SpO2:  98%    General: Appears in no acute distress Cardiovascular: S1-S2, regular Respiratory: Clear to auscultation bilaterally  Discharge Instructions   Discharge Instructions    Diet - low sodium heart healthy   Complete by: As directed    Increase activity slowly   Complete by: As directed      Allergies as of 10/25/2019   No Known Allergies     Medication List    TAKE these medications   blood glucose meter kit and supplies Dispense based on patient and insurance preference. Use up to four times daily as directed. (FOR ICD-9 250.00, 250.01).   insulin glargine 100 UNIT/ML injection Commonly known as: LANTUS Inject 0.3 mLs (30 Units total) into the skin daily. Start taking on: October 26, 2019   INSULIN SYRINGE .5CC/31GX5/16" 31G X 5/16" 0.5 ML Misc Use as directed   lisinopril 10 MG tablet Commonly known as: ZESTRIL Take 1 tablet (10 mg total) by mouth daily. Start taking on: October 26, 2019   metFORMIN 500 MG tablet Commonly known as: Glucophage Take 1 tablet (500 mg total) by mouth 2 (two) times daily with a meal.   metoprolol tartrate 25 MG tablet Commonly known as: LOPRESSOR Take 0.5 tablets (12.5 mg total) by mouth 2 (two) times daily.      No Known Allergies Follow-up Information    [place]. Go on 11/18/2019.   Why: @  2:30 for hospital followup Contact information: [place] [id number] [phone]           The results of significant diagnostics from this hospitalization (including imaging, microbiology, ancillary and laboratory) are listed below for reference.    Significant Diagnostic Studies: CT ABDOMEN PELVIS WO CONTRAST  Result Date: 10/21/2019 CLINICAL DATA:  24 year old male with 3 days of nausea vomiting constipation urinary  frequency and shortness of breath. Hyperglycemia on presentation. EXAM: CT ABDOMEN AND PELVIS WITHOUT CONTRAST TECHNIQUE: Multidetector CT imaging of the abdomen and pelvis was performed following the standard protocol without IV contrast. COMPARISON:  Report of CT Abdomen and Pelvis 05/05/2002 (no images available). FINDINGS: Lower chest: Negative. Hepatobiliary: Hepatic steatosis with some focal fatty sparing near the gallbladder fossa. Gallbladder appears negative. No bile duct enlargement. Pancreas: Fatty atrophy, but otherwise negative. Spleen: Negative. Adrenals/Urinary Tract: Normal adrenal glands. Negative noncontrast kidneys. No nephrolithiasis or hydronephrosis. Decompressed proximal ureters. Normal distal ureters. Diminutive and unremarkable urinary bladder. Stomach/Bowel: Decompressed and negative large bowel with no significant retained stool. Normal appendix on series 3, image 70. The sigmoid is mildly redundant. Negative terminal ileum. No dilated small bowel. Decompressed and negative stomach. No free air, free fluid. Vascular/Lymphatic: Vascular patency is not evaluated in the absence of IV contrast. No calcified atherosclerosis. No lymphadenopathy. Reproductive: Negative. Other: No pelvic free fluid. Musculoskeletal: Lower lumbar disc bulging and endplate spurring. No acute osseous abnormality identified. IMPRESSION: Negative noncontrast CT Abdomen and Pelvis aside from hepatic steatosis and fatty atrophy of the pancreas. Electronically Signed   By: Genevie Ann M.D.   On: 10/21/2019 20:00   US RENAL  Result Date: 10/22/2019 CLINICAL DATA:  Acute kidney injury EXAM: RENAL / URINARY TRACT ULTRASOUND COMPLETE COMPARISON:  CT abdomen pelvis 10/21/2019 FINDINGS: Right Kidney: Renal measurements: 10.10 x 4.4 x 4.2 cm = volume: 98.7 mL . Echogenicity within normal limits. No mass or hydronephrosis visualized. Left Kidney: Renal measurements: 12.2 x 6.4 x 4.9 cm = volume: 200.3 mL. Echogenicity within  normal limits. No mass or hydronephrosis visualized. Bladder: Appears normal for degree of bladder distention. Other: Technically difficult exam due to patient body habitus which limited availability of sonographic windowing. IMPRESSION: Asymmetry of the renal size, likely related to technical factors given asymmetric appearance on comparison CT from 1 day prior. Study was technically difficult due to patient body habitus limiting sonographic window availability. No acute renal abnormality is seen. Electronically Signed   By: Lovena Le M.D.   On: 10/22/2019 23:13   DG Abd Acute W/Chest  Result Date: 10/21/2019 CLINICAL DATA:  No pain with apparent constipation EXAM: DG ABDOMEN ACUTE W/ 1V CHEST COMPARISON:  None. FINDINGS: PA chest: Lungs are clear. Heart size and pulmonary vascularity are normal. No adenopathy. Supine and upright abdomen: There is moderate stool throughout the colon. There is no bowel dilatation or air-fluid level to suggest bowel obstruction. No free air. No abnormal calcifications. IMPRESSION: Moderate stool evident throughout the colon. No bowel dilatation or air-fluid level to suggest bowel obstruction. No free air. Lungs clear. Electronically Signed   By: Lowella Grip III M.D.   On: 10/21/2019 15:55    Microbiology: Recent Results (from the past 240 hour(s))  SARS CORONAVIRUS 2 (TAT 6-24 HRS) Nasopharyngeal Nasopharyngeal Swab     Status: None   Collection Time: 10/21/19  3:18 PM   Specimen: Nasopharyngeal Swab  Result Value Ref Range Status   SARS Coronavirus 2 NEGATIVE NEGATIVE Final    Comment: (NOTE)  SARS-CoV-2 target nucleic acids are NOT DETECTED. The SARS-CoV-2 RNA is generally detectable in upper and lower respiratory specimens during the acute phase of infection. Negative results do not preclude SARS-CoV-2 infection, do not rule out co-infections with other pathogens, and should not be used as the sole basis for treatment or other patient management  decisions. Negative results must be combined with clinical observations, patient history, and epidemiological information. The expected result is Negative. Fact Sheet for Patients: SugarRoll.be Fact Sheet for Healthcare Providers: https://www.woods-mathews.com/ This test is not yet approved or cleared by the [place] FDA and  has been authorized for detection and/or diagnosis of SARS-CoV-2 by FDA under an Emergency Use Authorization (EUA). This EUA will remain  in effect (meaning this test can be used) for the duration of the COVID-19 declaration under Section 56 4(b)(1) of the Act, 21 U.S.C. section 360bbb-3(b)(1), unless the authorization is terminated or revoked sooner. Performed at [facility], [place] [address], [place], [place] [address]   Culture, blood (routine x 2)     Status: None (Preliminary result)   Collection Time: 10/21/19  7:09 PM   Specimen: BLOOD  Result Value Ref Range Status   Specimen Description BLOOD LEFT ANTECUBITAL  Final   Special Requests   Final    BOTTLES DRAWN AEROBIC AND ANAEROBIC Blood Culture results may not be optimal due to an inadequate volume of blood received in culture bottles   Culture   Final    NO GROWTH 4 DAYS Performed at [facility], [place] [address], [place], [place] [address]    Report Status PENDING  Incomplete  Culture, blood (routine x 2)     Status: None (Preliminary result)   Collection Time: 10/22/19  5:49 AM   Specimen: BLOOD LEFT HAND  Result Value Ref Range Status   Specimen Description BLOOD LEFT HAND  Final   Special Requests   Final    BOTTLES DRAWN AEROBIC ONLY Blood Culture adequate volume   Culture   Final    NO GROWTH 3 DAYS Performed at [facility], [address] [place], [place] [address]    Report Status PENDING  Incomplete     Labs: Basic Metabolic Panel: Recent Labs  Lab 10/23/19 1456 10/23/19 1809 10/24/19 0416 10/25/19 0319  10/25/19 1201  NA 135 134* 137 136 137  K 3.5 3.3* 3.0* 3.7 3.8  CL 104 103 104 106 107  CO2 19* 20* 22 17* 18*  GLUCOSE 146* 199* 145* 220* 314*  BUN 12 12 8 6 7   CREATININE 1.22 1.16 1.11 0.79 0.88  CALCIUM 9.3 9.2 9.0 8.9 9.1   Liver Function Tests: Recent Labs  Lab 10/21/19 1522  AST 44*  ALT 50*  ALKPHOS 117  BILITOT 2.0*  PROT 9.2*  ALBUMIN 4.6   Recent Labs  Lab 10/21/19 1522  LIPASE 20   No results for input(s): AMMONIA in the last 168 hours. CBC: Recent Labs  Lab 10/21/19 1430 10/21/19 1531 10/21/19 1909 10/22/19 0542 10/23/19 0304  WBC 17.0*  --  20.3* 20.6* 15.4*  HGB 16.5 18.0* 16.6 16.1 14.2  HCT 50.8 53.0* 51.9 48.7 42.3  MCV 77.1*  --  78.5* 76.5* 75.7*  PLT 442*  --  390 335 269    BNP (last 3 results) Recent Labs    10/22/19 0542  BNP 16.9    CBG: Recent Labs  Lab 10/24/19 1450 10/24/19 1823 10/24/19 1904 10/24/19 2047 10/25/19 1201  GLUCAP 150* 186*  210* 190* 265*       Signed:  Oswald Hillock MD.  [facility] 10/25/2019, 2:20 PM

## 2019-10-25 NOTE — Care Management (Signed)
Patient given MATCH letter to fill prescriptions at $3/each. Patient has f/u at clinic before 30 days supply is up.

## 2019-10-26 LAB — CULTURE, BLOOD (ROUTINE X 2): Culture: NO GROWTH

## 2019-10-27 LAB — CULTURE, BLOOD (ROUTINE X 2)
Culture: NO GROWTH
Special Requests: ADEQUATE

## 2019-10-27 LAB — GLUCOSE, CAPILLARY: Glucose-Capillary: 256 mg/dL — ABNORMAL HIGH (ref 70–99)

## 2019-11-18 ENCOUNTER — Other Ambulatory Visit: Payer: Self-pay

## 2019-11-18 ENCOUNTER — Ambulatory Visit (INDEPENDENT_AMBULATORY_CARE_PROVIDER_SITE_OTHER): Payer: Self-pay | Admitting: Primary Care

## 2019-11-18 ENCOUNTER — Encounter (INDEPENDENT_AMBULATORY_CARE_PROVIDER_SITE_OTHER): Payer: Self-pay | Admitting: Primary Care

## 2019-11-18 DIAGNOSIS — E1110 Type 2 diabetes mellitus with ketoacidosis without coma: Secondary | ICD-10-CM

## 2019-11-18 DIAGNOSIS — I10 Essential (primary) hypertension: Secondary | ICD-10-CM

## 2019-11-18 DIAGNOSIS — Z09 Encounter for follow-up examination after completed treatment for conditions other than malignant neoplasm: Secondary | ICD-10-CM

## 2019-11-18 DIAGNOSIS — Z7689 Persons encountering health services in other specified circumstances: Secondary | ICD-10-CM

## 2019-11-18 MED ORDER — METOPROLOL TARTRATE 25 MG PO TABS: 12.5000 mg | ORAL_TABLET | Freq: Two times a day (BID) | ORAL | 1 refills | Status: DC

## 2019-11-18 MED ORDER — METFORMIN HCL 1000 MG PO TABS: 1000.0000 mg | ORAL_TABLET | Freq: Two times a day (BID) | ORAL | 1 refills | Status: DC

## 2019-11-18 MED ORDER — LISINOPRIL 20 MG PO TABS: 20.0000 mg | ORAL_TABLET | Freq: Every day | ORAL | 1 refills | Status: DC

## 2019-11-18 MED ORDER — PRAVASTATIN SODIUM 20 MG PO TABS: 20.0000 mg | ORAL_TABLET | Freq: Every day | ORAL | 1 refills | Status: DC

## 2019-11-18 MED ORDER — GLIPIZIDE 10 MG PO TABS: 10.0000 mg | ORAL_TABLET | Freq: Two times a day (BID) | ORAL | 1 refills | Status: DC

## 2019-11-18 NOTE — Progress Notes (Signed)
CBG before eating was 334  Lowest CBG was approximately 230  Highest CBG was approximately 380

## 2019-11-18 NOTE — Progress Notes (Signed)
Virtual Visit via Telephone Note  I connected with Michael [name] on 11/18/19 at  2:30 PM EST by telephone and verified that I am speaking with the correct person using two identifiers.   I discussed the limitations, risks, security and privacy concerns of performing an evaluation and management service by telephone and the availability of in person appointments. I also discussed with the patient that there may be a patient responsible charge related to this service. The patient expressed understanding and agreed to proceed.   History of Present Illness: Michael [name] is a new diagnosis of diabetes  presented to the Urgent care 10/21/2019  for evaluation of periumbilical abdominal pain with constipation x3 days.  Urgent care he was found to have blood sugar above 500 with urine ketones of 160 and 300 protein.  He was transported to the ED for further evaluation and admitted for DKA.  Past Medical History:  Diagnosis Date  . Diabetes mellitus without complication ([place])    Morbid obesity BMI 60   Observations/Objective: Review of Systems  Gastrointestinal: Positive for abdominal pain and nausea.       Polyphagia   Genitourinary: Positive for frequency and urgency.   Assessment and Plan: Michael [name] was seen today for diabetic ketoacidosis.  Diagnoses and all orders for this visit:  DM (diabetes mellitus) type 2, uncontrolled, with ketoacidosis ([place]) ADA recommends the following therapeutic goals for glycemic control related to A1c measurements: Goal of therapy: Less than 6.5 hemoglobin A1c. Explained what a A1C represented , diabetic teaching . Medication changed from metformin 500 mg bid to metformin 1000 mg bid added  Glucotrol 10 mg bid continue lantus 30 units at bedtime. Discussed s/s of hypo/hyper diabetes.Reduce foods that are high in carbohydrates are the following rice, potatoes, breads, sugars, and pastas.  Reduction in the intake (eating) will assist in lowering your blood sugars. -      Lipid panel; Future  Essential hypertension Counseled on blood pressure goal of less than 130/80, low-sodium, DASH diet, medication compliance, 150 minutes of moderate intensity exercise per week. Discussed medication compliance, adverse effects. Increased lisinopril 20mg  daily continue metoprolol tartrate (LOPRESSOR) 25 MG tablet; Take 0.5 tablets (12.5 mg total) by mouth 2 (two) times daily.  Encounter to establish care Michael Mire, NP-C will be your  (PCP) she is mastered prepared . She is skilled to diagnosed and treat illness. Also able to answer health concern as well as continuing care of varied medical conditions, not limited by cause, organ system, or diagnosis.   Hospital discharge follow-up  DKA and hypertension. 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 Follow-up PCP in 2 weeks,   Other orders -     metFORMIN (GLUCOPHAGE) 1000 MG tablet; Take 1 tablet (1,000 mg total) by mouth 2 (two) times daily with a meal. -     glipiZIDE (GLUCOTROL) 10 MG tablet; Take 1 tablet (10 mg total) by mouth 2 (two) times daily before a meal. -     lisinopril (ZESTRIL) 20 MG tablet; Take 1 tablet (20 mg total) by mouth daily. -     pravastatin (PRAVACHOL) 20 MG tablet; Take 1 tablet (20 mg total) by mouth daily.    Follow Up Instructions:    I discussed the assessment and treatment plan with the patient. The patient was provided an opportunity to ask questions and all were answered. The patient agreed with the plan and demonstrated an understanding of the instructions.   The patient was advised to call  back or seek an in-person evaluation if the symptoms worsen or if the condition fails to improve as anticipated.  I provided 30 minutes of non-face-to-face time during this encounter.    Grayce Sessions, NP

## 2019-12-16 ENCOUNTER — Ambulatory Visit (INDEPENDENT_AMBULATORY_CARE_PROVIDER_SITE_OTHER): Payer: Medicaid Other | Admitting: Primary Care

## 2020-03-14 ENCOUNTER — Encounter (HOSPITAL_BASED_OUTPATIENT_CLINIC_OR_DEPARTMENT_OTHER): Payer: Self-pay | Admitting: Emergency Medicine

## 2020-03-14 ENCOUNTER — Inpatient Hospital Stay (HOSPITAL_BASED_OUTPATIENT_CLINIC_OR_DEPARTMENT_OTHER)
Admission: EM | Admit: 2020-03-14 | Discharge: 2020-03-19 | DRG: 637 | Disposition: A | Discharge status: Home / Self Care | Payer: Medicaid Other | Attending: Internal Medicine | Admitting: Internal Medicine

## 2020-03-14 ENCOUNTER — Other Ambulatory Visit: Payer: Self-pay

## 2020-03-14 DIAGNOSIS — D72828 Other elevated white blood cell count: Secondary | ICD-10-CM | POA: Diagnosis present

## 2020-03-14 DIAGNOSIS — E876 Hypokalemia: Secondary | ICD-10-CM | POA: Diagnosis present

## 2020-03-14 DIAGNOSIS — Z20822 Contact with and (suspected) exposure to covid-19: Secondary | ICD-10-CM | POA: Diagnosis present

## 2020-03-14 DIAGNOSIS — T383X6A Underdosing of insulin and oral hypoglycemic [antidiabetic] drugs, initial encounter: Secondary | ICD-10-CM | POA: Diagnosis present

## 2020-03-14 DIAGNOSIS — E785 Hyperlipidemia, unspecified: Secondary | ICD-10-CM | POA: Diagnosis present

## 2020-03-14 DIAGNOSIS — Z7289 Other problems related to lifestyle: Secondary | ICD-10-CM

## 2020-03-14 DIAGNOSIS — E871 Hypo-osmolality and hyponatremia: Secondary | ICD-10-CM | POA: Diagnosis present

## 2020-03-14 DIAGNOSIS — R1310 Dysphagia, unspecified: Secondary | ICD-10-CM | POA: Diagnosis present

## 2020-03-14 DIAGNOSIS — N179 Acute kidney failure, unspecified: Secondary | ICD-10-CM | POA: Diagnosis present

## 2020-03-14 DIAGNOSIS — K226 Gastro-esophageal laceration-hemorrhage syndrome: Secondary | ICD-10-CM | POA: Diagnosis present

## 2020-03-14 DIAGNOSIS — R112 Nausea with vomiting, unspecified: Secondary | ICD-10-CM | POA: Diagnosis present

## 2020-03-14 DIAGNOSIS — I10 Essential (primary) hypertension: Secondary | ICD-10-CM | POA: Diagnosis present

## 2020-03-14 DIAGNOSIS — E1110 Type 2 diabetes mellitus with ketoacidosis without coma: Principal | ICD-10-CM | POA: Diagnosis present

## 2020-03-14 DIAGNOSIS — Z794 Long term (current) use of insulin: Secondary | ICD-10-CM

## 2020-03-14 DIAGNOSIS — Z79899 Other long term (current) drug therapy: Secondary | ICD-10-CM

## 2020-03-14 DIAGNOSIS — K209 Esophagitis, unspecified without bleeding: Secondary | ICD-10-CM | POA: Diagnosis present

## 2020-03-14 DIAGNOSIS — Z6841 Body Mass Index (BMI) 40.0 and over, adult: Secondary | ICD-10-CM

## 2020-03-14 DIAGNOSIS — Z91120 Patient's intentional underdosing of medication regimen due to financial hardship: Secondary | ICD-10-CM

## 2020-03-14 DIAGNOSIS — E860 Dehydration: Secondary | ICD-10-CM | POA: Diagnosis present

## 2020-03-14 DIAGNOSIS — Z713 Dietary counseling and surveillance: Secondary | ICD-10-CM

## 2020-03-14 MED ORDER — SODIUM CHLORIDE 0.9 % IV BOLUS
1000.0000 mL | Freq: Once | INTRAVENOUS | Status: AC
  Administered 2020-03-15: 1000 mL via INTRAVENOUS

## 2020-03-14 NOTE — ED Triage Notes (Signed)
Pt reports coughing/vomiting up blood for the past three days; Pt reports hx of T2D; pain in left chest 4/10; pt reports SHOB; does not show signs of respiratory distress

## 2020-03-15 ENCOUNTER — Emergency Department (HOSPITAL_BASED_OUTPATIENT_CLINIC_OR_DEPARTMENT_OTHER): Payer: Medicaid Other

## 2020-03-15 ENCOUNTER — Encounter (HOSPITAL_COMMUNITY): Payer: Self-pay | Admitting: Internal Medicine

## 2020-03-15 DIAGNOSIS — R112 Nausea with vomiting, unspecified: Secondary | ICD-10-CM | POA: Diagnosis present

## 2020-03-15 DIAGNOSIS — N179 Acute kidney failure, unspecified: Secondary | ICD-10-CM

## 2020-03-15 DIAGNOSIS — E1110 Type 2 diabetes mellitus with ketoacidosis without coma: Principal | ICD-10-CM

## 2020-03-15 LAB — CBC WITH DIFFERENTIAL/PLATELET
Abs Immature Granulocytes: 0.16 10*3/uL — ABNORMAL HIGH (ref 0.00–0.07)
Basophils Absolute: 0.1 10*3/uL (ref 0.0–0.1)
Basophils Relative: 0 %
Eosinophils Absolute: 0 10*3/uL (ref 0.0–0.5)
Eosinophils Relative: 0 %
HCT: 47.3 % (ref 39.0–52.0)
Hemoglobin: 15.2 g/dL (ref 13.0–17.0)
Immature Granulocytes: 1 %
Lymphocytes Relative: 7 %
Lymphs Abs: 1.5 10*3/uL (ref 0.7–4.0)
MCH: 24.9 pg — ABNORMAL LOW (ref 26.0–34.0)
MCHC: 32.1 g/dL (ref 30.0–36.0)
MCV: 77.5 fL — ABNORMAL LOW (ref 80.0–100.0)
Monocytes Absolute: 2 10*3/uL — ABNORMAL HIGH (ref 0.1–1.0)
Monocytes Relative: 9 %
Neutro Abs: 17.8 10*3/uL — ABNORMAL HIGH (ref 1.7–7.7)
Neutrophils Relative %: 83 %
Platelets: 422 10*3/uL — ABNORMAL HIGH (ref 150–400)
RBC: 6.1 MIL/uL — ABNORMAL HIGH (ref 4.22–5.81)
RDW: 14.2 % (ref 11.5–15.5)
WBC: 21.5 10*3/uL — ABNORMAL HIGH (ref 4.0–10.5)
nRBC: 0 % (ref 0.0–0.2)

## 2020-03-15 LAB — CBC
HCT: 45 % (ref 39.0–52.0)
HCT: 46 % (ref 39.0–52.0)
Hemoglobin: 14.6 g/dL (ref 13.0–17.0)
Hemoglobin: 14.8 g/dL (ref 13.0–17.0)
MCH: 25.2 pg — ABNORMAL LOW (ref 26.0–34.0)
MCH: 25.7 pg — ABNORMAL LOW (ref 26.0–34.0)
MCHC: 32.2 g/dL (ref 30.0–36.0)
MCHC: 32.4 g/dL (ref 30.0–36.0)
MCV: 77.6 fL — ABNORMAL LOW (ref 80.0–100.0)
MCV: 79.9 fL — ABNORMAL LOW (ref 80.0–100.0)
Platelets: 343 10*3/uL (ref 150–400)
Platelets: 400 10*3/uL (ref 150–400)
RBC: 5.76 MIL/uL (ref 4.22–5.81)
RBC: 5.8 MIL/uL (ref 4.22–5.81)
RDW: 14.2 % (ref 11.5–15.5)
RDW: 14.2 % (ref 11.5–15.5)
WBC: 21.3 10*3/uL — ABNORMAL HIGH (ref 4.0–10.5)
WBC: 22 10*3/uL — ABNORMAL HIGH (ref 4.0–10.5)
nRBC: 0 % (ref 0.0–0.2)
nRBC: 0 % (ref 0.0–0.2)

## 2020-03-15 LAB — BASIC METABOLIC PANEL
Anion gap: 28 — ABNORMAL HIGH (ref 5–15)
Anion gap: 28 — ABNORMAL HIGH (ref 5–15)
Anion gap: 21 — ABNORMAL HIGH (ref 5–15)
Anion gap: 18 — ABNORMAL HIGH (ref 5–15)
Anion gap: 20 — ABNORMAL HIGH (ref 5–15)
BUN: 63 mg/dL — ABNORMAL HIGH (ref 6–20)
BUN: 67 mg/dL — ABNORMAL HIGH (ref 6–20)
BUN: 72 mg/dL — ABNORMAL HIGH (ref 6–20)
BUN: 64 mg/dL — ABNORMAL HIGH (ref 6–20)
BUN: 69 mg/dL — ABNORMAL HIGH (ref 6–20)
CO2: 13 mmol/L — ABNORMAL LOW (ref 22–32)
CO2: 12 mmol/L — ABNORMAL LOW (ref 22–32)
CO2: 20 mmol/L — ABNORMAL LOW (ref 22–32)
CO2: 18 mmol/L — ABNORMAL LOW (ref 22–32)
CO2: 21 mmol/L — ABNORMAL LOW (ref 22–32)
Calcium: 10 mg/dL (ref 8.9–10.3)
Calcium: 10 mg/dL (ref 8.9–10.3)
Calcium: 10.3 mg/dL (ref 8.9–10.3)
Calcium: 10.1 mg/dL (ref 8.9–10.3)
Calcium: 10.5 mg/dL — ABNORMAL HIGH (ref 8.9–10.3)
Chloride: 86 mmol/L — ABNORMAL LOW (ref 98–111)
Chloride: 88 mmol/L — ABNORMAL LOW (ref 98–111)
Chloride: 97 mmol/L — ABNORMAL LOW (ref 98–111)
Chloride: 101 mmol/L (ref 98–111)
Chloride: 97 mmol/L — ABNORMAL LOW (ref 98–111)
Creatinine, Ser: 2.7 mg/dL — ABNORMAL HIGH (ref 0.61–1.24)
Creatinine, Ser: 2.71 mg/dL — ABNORMAL HIGH (ref 0.61–1.24)
Creatinine, Ser: 3.38 mg/dL — ABNORMAL HIGH (ref 0.61–1.24)
Creatinine, Ser: 3.19 mg/dL — ABNORMAL HIGH (ref 0.61–1.24)
Creatinine, Ser: 3.03 mg/dL — ABNORMAL HIGH (ref 0.61–1.24)
GFR calc Af Amer: 37 mL/min — ABNORMAL LOW (ref >60)
GFR calc Af Amer: 37 mL/min — ABNORMAL LOW (ref >60)
GFR calc Af Amer: 28 mL/min — ABNORMAL LOW (ref >60)
GFR calc Af Amer: 30 mL/min — ABNORMAL LOW (ref >60)
GFR calc Af Amer: 32 mL/min — ABNORMAL LOW (ref >60)
GFR calc non Af Amer: 32 mL/min — ABNORMAL LOW (ref >60)
GFR calc non Af Amer: 32 mL/min — ABNORMAL LOW (ref >60)
GFR calc non Af Amer: 24 mL/min — ABNORMAL LOW (ref >60)
GFR calc non Af Amer: 26 mL/min — ABNORMAL LOW (ref >60)
GFR calc non Af Amer: 28 mL/min — ABNORMAL LOW (ref >60)
Glucose, Bld: 748 mg/dL (ref 70–99)
Glucose, Bld: 682 mg/dL (ref 70–99)
Glucose, Bld: 231 mg/dL — ABNORMAL HIGH (ref 70–99)
Glucose, Bld: 219 mg/dL — ABNORMAL HIGH (ref 70–99)
Glucose, Bld: 180 mg/dL — ABNORMAL HIGH (ref 70–99)
Potassium: 5.1 mmol/L (ref 3.5–5.1)
Potassium: 4.5 mmol/L (ref 3.5–5.1)
Potassium: 4.6 mmol/L (ref 3.5–5.1)
Potassium: 4.6 mmol/L (ref 3.5–5.1)
Potassium: 4.6 mmol/L (ref 3.5–5.1)
Sodium: 127 mmol/L — ABNORMAL LOW (ref 135–145)
Sodium: 128 mmol/L — ABNORMAL LOW (ref 135–145)
Sodium: 138 mmol/L (ref 135–145)
Sodium: 137 mmol/L (ref 135–145)
Sodium: 138 mmol/L (ref 135–145)

## 2020-03-15 LAB — ABO/RH: ABO/RH(D): O POS

## 2020-03-15 LAB — GLUCOSE, CAPILLARY
Glucose-Capillary: >600 mg/dL (ref 70–99)
Glucose-Capillary: >600 mg/dL (ref 70–99)
Glucose-Capillary: >600 mg/dL (ref 70–99)
Glucose-Capillary: 501 mg/dL (ref 70–99)
Glucose-Capillary: 532 mg/dL (ref 70–99)
Glucose-Capillary: 517 mg/dL (ref 70–99)
Glucose-Capillary: 327 mg/dL — ABNORMAL HIGH (ref 70–99)
Glucose-Capillary: 290 mg/dL — ABNORMAL HIGH (ref 70–99)
Glucose-Capillary: 227 mg/dL — ABNORMAL HIGH (ref 70–99)
Glucose-Capillary: 219 mg/dL — ABNORMAL HIGH (ref 70–99)
Glucose-Capillary: 232 mg/dL — ABNORMAL HIGH (ref 70–99)
Glucose-Capillary: 223 mg/dL — ABNORMAL HIGH (ref 70–99)
Glucose-Capillary: 228 mg/dL — ABNORMAL HIGH (ref 70–99)
Glucose-Capillary: 198 mg/dL — ABNORMAL HIGH (ref 70–99)
Glucose-Capillary: 185 mg/dL — ABNORMAL HIGH (ref 70–99)
Glucose-Capillary: 172 mg/dL — ABNORMAL HIGH (ref 70–99)
Glucose-Capillary: 190 mg/dL — ABNORMAL HIGH (ref 70–99)

## 2020-03-15 LAB — I-STAT VENOUS BLOOD GAS, ED
Acid-base deficit: 9 mmol/L — ABNORMAL HIGH (ref 0.0–2.0)
Bicarbonate: 16.8 mmol/L — ABNORMAL LOW (ref 20.0–28.0)
Calcium, Ion: 1.34 mmol/L (ref 1.15–1.40)
HCT: 52 % (ref 39.0–52.0)
Hemoglobin: 17.7 g/dL — ABNORMAL HIGH (ref 13.0–17.0)
O2 Saturation: 64 %
Patient temperature: 98.3
Potassium: 5.4 mmol/L — ABNORMAL HIGH (ref 3.5–5.1)
Sodium: 123 mmol/L — ABNORMAL LOW (ref 135–145)
TCO2: 18 mmol/L — ABNORMAL LOW (ref 22–32)
pCO2, Ven: 35.7 mmHg — ABNORMAL LOW (ref 44.0–60.0)
pH, Ven: 7.279 (ref 7.250–7.430)
pO2, Ven: 37 mmHg (ref 32.0–45.0)

## 2020-03-15 LAB — TROPONIN I (HIGH SENSITIVITY)
Troponin I (High Sensitivity): 3 ng/L (ref <18)
Troponin I (High Sensitivity): 3 ng/L (ref <18)
Troponin I (High Sensitivity): 2 ng/L (ref <18)
Troponin I (High Sensitivity): 2 ng/L (ref <18)

## 2020-03-15 LAB — BETA-HYDROXYBUTYRIC ACID
Beta-Hydroxybutyric Acid: >8 mmol/L — ABNORMAL HIGH (ref 0.05–0.27)
Beta-Hydroxybutyric Acid: >8 mmol/L — ABNORMAL HIGH (ref 0.05–0.27)
Beta-Hydroxybutyric Acid: 3.86 mmol/L — ABNORMAL HIGH (ref 0.05–0.27)
Beta-Hydroxybutyric Acid: 3.55 mmol/L — ABNORMAL HIGH (ref 0.05–0.27)

## 2020-03-15 LAB — URINALYSIS, ROUTINE W REFLEX MICROSCOPIC
Glucose, UA: >=500 mg/dL — AB
Ketones, ur: >80 mg/dL — AB
Leukocytes,Ua: NEGATIVE
Nitrite: NEGATIVE
Protein, ur: 30 mg/dL — AB
Specific Gravity, Urine: 1.02 (ref 1.005–1.030)
pH: 5.5 (ref 5.0–8.0)

## 2020-03-15 LAB — URINALYSIS, MICROSCOPIC (REFLEX)

## 2020-03-15 LAB — COMPREHENSIVE METABOLIC PANEL
ALT: 24 U/L (ref 0–44)
AST: 19 U/L (ref 15–41)
Albumin: 4.4 g/dL (ref 3.5–5.0)
Alkaline Phosphatase: 91 U/L (ref 38–126)
Anion gap: 30 — ABNORMAL HIGH (ref 5–15)
BUN: 65 mg/dL — ABNORMAL HIGH (ref 6–20)
CO2: 14 mmol/L — ABNORMAL LOW (ref 22–32)
Calcium: 10.9 mg/dL — ABNORMAL HIGH (ref 8.9–10.3)
Chloride: 80 mmol/L — ABNORMAL LOW (ref 98–111)
Creatinine, Ser: 2.71 mg/dL — ABNORMAL HIGH (ref 0.61–1.24)
GFR calc Af Amer: 37 mL/min — ABNORMAL LOW (ref >60)
GFR calc non Af Amer: 32 mL/min — ABNORMAL LOW (ref >60)
Glucose, Bld: 806 mg/dL (ref 70–99)
Potassium: 5.4 mmol/L — ABNORMAL HIGH (ref 3.5–5.1)
Sodium: 124 mmol/L — ABNORMAL LOW (ref 135–145)
Total Bilirubin: 2.1 mg/dL — ABNORMAL HIGH (ref 0.3–1.2)
Total Protein: 8.5 g/dL — ABNORMAL HIGH (ref 6.5–8.1)

## 2020-03-15 LAB — MRSA PCR SCREENING: MRSA by PCR: NEGATIVE

## 2020-03-15 LAB — LIPASE, BLOOD: Lipase: 23 U/L (ref 11–51)

## 2020-03-15 LAB — TYPE AND SCREEN
ABO/RH(D): O POS
Antibody Screen: NEGATIVE

## 2020-03-15 LAB — CBG MONITORING, ED
Glucose-Capillary: >600 mg/dL (ref 70–99)
Glucose-Capillary: >600 mg/dL (ref 70–99)

## 2020-03-15 LAB — SARS CORONAVIRUS 2 BY RT PCR (HOSPITAL ORDER, PERFORMED IN ~~LOC~~ HOSPITAL LAB): SARS Coronavirus 2: NEGATIVE

## 2020-03-15 MED ORDER — SODIUM CHLORIDE 0.9 % IV SOLN: INTRAVENOUS | Status: DC

## 2020-03-15 MED ORDER — INSULIN REGULAR(HUMAN) IN NACL 100-0.9 UT/100ML-% IV SOLN
INTRAVENOUS | Status: DC
  Administered 2020-03-15: 13 [IU]/h via INTRAVENOUS
  Administered 2020-03-15: 8 [IU]/h via INTRAVENOUS
  Administered 2020-03-15: 10 [IU]/h via INTRAVENOUS
  Administered 2020-03-16: 8.5 [IU]/h via INTRAVENOUS
  Filled 2020-03-15 (×4): qty 100

## 2020-03-15 MED ORDER — DEXTROSE-NACL 5-0.45 % IV SOLN: INTRAVENOUS | Status: DC

## 2020-03-15 MED ORDER — SODIUM CHLORIDE 0.9 % IV BOLUS
1000.0000 mL | Freq: Once | INTRAVENOUS | Status: AC
  Administered 2020-03-15: 1000 mL via INTRAVENOUS

## 2020-03-15 MED ORDER — ACETAMINOPHEN 325 MG PO TABS: 650.0000 mg | ORAL_TABLET | ORAL | Status: DC | PRN

## 2020-03-15 MED ORDER — ONDANSETRON HCL 4 MG/2ML IJ SOLN
4.0000 mg | Freq: Four times a day (QID) | INTRAMUSCULAR | Status: DC | PRN
  Administered 2020-03-15 – 2020-03-16 (×2): 4 mg via INTRAVENOUS
  Filled 2020-03-15 (×3): qty 2

## 2020-03-15 MED ORDER — PANTOPRAZOLE SODIUM 40 MG IV SOLR
40.0000 mg | INTRAVENOUS | Status: DC
  Administered 2020-03-15 – 2020-03-16 (×2): 40 mg via INTRAVENOUS
  Filled 2020-03-15 (×2): qty 40

## 2020-03-15 MED ORDER — CHLORHEXIDINE GLUCONATE CLOTH 2 % EX PADS
6.0000 | MEDICATED_PAD | Freq: Every day | CUTANEOUS | Status: DC
  Administered 2020-03-15 – 2020-03-19 (×6): 6 via TOPICAL

## 2020-03-15 MED ORDER — ORAL CARE MOUTH RINSE
15.0000 mL | Freq: Two times a day (BID) | OROMUCOSAL | Status: DC
  Administered 2020-03-15 – 2020-03-19 (×7): 15 mL via OROMUCOSAL

## 2020-03-15 MED ORDER — DEXTROSE 50 % IV SOLN: 0.0000 mL | INTRAVENOUS | Status: DC | PRN

## 2020-03-15 MED ORDER — ONDANSETRON HCL 4 MG/2ML IJ SOLN: 4.0000 mg | Freq: Four times a day (QID) | INTRAMUSCULAR | Status: DC

## 2020-03-15 NOTE — ED Provider Notes (Addendum)
[facility] EMERGENCY DEPARTMENT Provider Note   CSN: [id number] Arrival date & time: 03/14/20  2316     History Chief Complaint  Patient presents with  . Chest Pain  . spitting up blood    Michael [name] is a 24 y.o. male.  HPI     This is a 24 year old male with history of diabetes who presents with hyperglycemia, vomiting, chest discomfort.  Patient reports several days ago he noted his blood pressures to be elevated.  He states that he worked on trying to get them into lower range but then developed vomiting.  He reports several episodes of nonbilious, nonbloody emesis.  He states that after vomiting he developed some burning chest discomfort and "I think I have reflux."  He also has noted since that time streaks of blood in his emesis.  Denies any abdominal pain or chest discomfort.  Denies shortness of breath, fevers, cough.  Patient denies any recent sick contacts or fevers.  Patient was admitted for DKA and discharged on insulin.  He has not been using insulin as he states he cannot afford it.  He does report taking his oral diabetes medications including Metformin and glipizide.  Past Medical History:  Diagnosis Date  . Diabetes mellitus without complication [facility])     Patient Active Problem List   Diagnosis Date Noted  . DKA (diabetic ketoacidoses) ([place]) 10/21/2019  . Sports physical 08/12/2012    History reviewed. No pertinent surgical history.     Family History  Problem Relation Age of Onset  . Sudden death Neg Hx   . Heart attack Neg Hx     Social History   Tobacco Use  . Smoking status: Never Smoker  . Smokeless tobacco: Never Used  Substance Use Topics  . Alcohol use: Not Currently  . Drug use: Not Currently    Home Medications Prior to Admission medications   Medication Sig Start Date End Date Taking? Authorizing Provider  blood glucose meter kit and supplies Dispense based on patient and insurance preference. Use up to four times  daily as directed. (FOR ICD-9 250.00, 250.01). 10/25/19   Oswald Hillock, MD  glipiZIDE (GLUCOTROL) 10 MG tablet Take 1 tablet (10 mg total) by mouth 2 (two) times daily before a meal. 11/18/19   Kerin Perna, NP  insulin glargine (LANTUS) 100 UNIT/ML injection Inject 0.3 mLs (30 Units total) into the skin daily. 10/26/19   Oswald Hillock, MD  Insulin Syringe-Needle U-100 (INSULIN SYRINGE .5CC/31GX5/16") 31G X 5/16" 0.5 ML MISC Use as directed 10/25/19   Oswald Hillock, MD  lisinopril (ZESTRIL) 20 MG tablet Take 1 tablet (20 mg total) by mouth daily. 11/18/19   Kerin Perna, NP  metFORMIN (GLUCOPHAGE) 1000 MG tablet Take 1 tablet (1,000 mg total) by mouth 2 (two) times daily with a meal. 11/18/19   Kerin Perna, NP  metoprolol tartrate (LOPRESSOR) 25 MG tablet Take 0.5 tablets (12.5 mg total) by mouth 2 (two) times daily. 11/18/19 02/16/20  Kerin Perna, NP  pravastatin (PRAVACHOL) 20 MG tablet Take 1 tablet (20 mg total) by mouth daily. 11/18/19   Kerin Perna, NP    Allergies    Patient has no known allergies.  Review of Systems   Review of Systems  Constitutional: Negative for fever.  Respiratory: Negative for shortness of breath.   Cardiovascular: Negative for chest pain.  Gastrointestinal: Positive for nausea and vomiting. Negative for abdominal pain, blood in stool and constipation.  Genitourinary: Negative for dysuria.  Neurological: Negative for headaches.  All other systems reviewed and are negative.   Physical Exam Updated Vital Signs BP 104/73   Pulse (!) 128   Temp 98.3 F (36.8 C) (Oral)   Resp (!) 21   Ht 1.88 m (_0 )   Wt (!) 190.5 kg   SpO2 99%   BMI 53.92 kg/m   Physical Exam Vitals and nursing note reviewed.  Constitutional:      Appearance: He is well-developed. He is not ill-appearing.     Comments: Morbidly obese  HENT:     Head: Normocephalic and atraumatic.  Eyes:     Pupils: Pupils are equal, round, and reactive to light.    Cardiovascular:     Rate and Rhythm: Regular rhythm. Tachycardia present.     Heart sounds: Normal heart sounds. No murmur.  Pulmonary:     Effort: Pulmonary effort is normal. Tachypnea present. No respiratory distress.     Breath sounds: Normal breath sounds. No wheezing.  Abdominal:     General: Bowel sounds are normal.     Palpations: Abdomen is soft.     Tenderness: There is no abdominal tenderness. There is no rebound.  Musculoskeletal:     Cervical back: Neck supple.     Right lower leg: No edema.     Left lower leg: No edema.  Lymphadenopathy:     Cervical: No cervical adenopathy.  Skin:    General: Skin is warm and dry.  Neurological:     Mental Status: He is alert and oriented to person, place, and time.  Psychiatric:        Mood and Affect: Mood normal.     ED Results / Procedures / Treatments   Labs (all labs ordered are listed, but only abnormal results are displayed) Labs Reviewed  CBC WITH DIFFERENTIAL/PLATELET - Abnormal; Notable for the following components:      Result Value   WBC 21.5 (*)    RBC 6.10 (*)    MCV 77.5 (*)    MCH 24.9 (*)    Platelets 422 (*)    Neutro Abs 17.8 (*)    Monocytes Absolute 2.0 (*)    Abs Immature Granulocytes 0.16 (*)    All other components within normal limits  COMPREHENSIVE METABOLIC PANEL - Abnormal; Notable for the following components:   Sodium 124 (*)    Potassium 5.4 (*)    Chloride 80 (*)    CO2 14 (*)    Glucose, Bld 806 (*)    BUN 65 (*)    Creatinine, Ser 2.71 (*)    Calcium 10.9 (*)    Total Protein 8.5 (*)    Total Bilirubin 2.1 (*)    GFR calc non Af Amer 32 (*)    GFR calc Af Amer 37 (*)    Anion gap 30 (*)    All other components within normal limits  CBG MONITORING, ED - Abnormal; Notable for the following components:   Glucose-Capillary >600 (*)    All other components within normal limits  I-STAT VENOUS BLOOD GAS, ED - Abnormal; Notable for the following components:   pCO2, Ven 35.7 (*)     Bicarbonate 16.8 (*)    TCO2 18 (*)    Acid-base deficit 9.0 (*)    Sodium 123 (*)    Potassium 5.4 (*)    Hemoglobin 17.7 (*)    All other components within normal limits  SARS CORONAVIRUS 2 BY RT PCR (  HOSPITAL ORDER, [facility])  LIPASE, BLOOD  BETA-HYDROXYBUTYRIC ACID  BETA-HYDROXYBUTYRIC ACID  BETA-HYDROXYBUTYRIC ACID  URINALYSIS, ROUTINE W REFLEX MICROSCOPIC  CBG MONITORING, ED  TROPONIN I (HIGH SENSITIVITY)  TROPONIN I (HIGH SENSITIVITY)    EKG EKG Interpretation  Date/Time:  Sunday March 14 2020 23:18:35 EDT Ventricular Rate:  126 PR Interval:  140 QRS Duration: 82 QT Interval:  320 QTC Calculation: 463 R Axis:   48 Text Interpretation: Sinus tachycardia T wave abnormality, consider inferior ischemia Abnormal ECG Confirmed by Thayer Jew [phone]) on 03/15/2020 1:12:16 AM   Radiology DG Chest Portable 1 View  Result Date: 03/15/2020 CLINICAL DATA:  Chest pain.  Cough.  Shortness of breath. EXAM: PORTABLE CHEST 1 VIEW COMPARISON:  None. FINDINGS: The cardiomediastinal contours are normal. The lungs are clear. Pulmonary vasculature is normal. No consolidation, pleural effusion, or pneumothorax. No acute osseous abnormalities are seen. Large habitus. IMPRESSION: Negative portable AP view of the chest. Electronically Signed   By: Keith Rake M.D.   On: 03/15/2020 00:43    Procedures Procedures (including critical care time)  CRITICAL CARE Performed by: Merryl Hacker   Total critical care time: 65 minutes  Critical care time was exclusive of separately billable procedures and treating other patients.  Critical care was necessary to treat or prevent imminent or life-threatening deterioration.  Critical care was time spent personally by me on the following activities: development of treatment plan with patient and/or surrogate as well as nursing, discussions with consultants, evaluation of patient's response to treatment,  examination of patient, obtaining history from patient or surrogate, ordering and performing treatments and interventions, ordering and review of laboratory studies, ordering and review of radiographic studies, pulse oximetry and re-evaluation of patient's condition.   Medications Ordered in ED Medications  sodium chloride 0.9 % bolus 1,000 mL (1,000 mLs Intravenous New Bag/Given 03/15/20 0046)  sodium chloride 0.9 % bolus 1,000 mL (1,000 mLs Intravenous New Bag/Given 03/15/20 0155)    ED Course  I have reviewed the triage vital signs and the nursing notes.  Pertinent labs & imaging results that were available during my care of the patient were reviewed by me and considered in my medical decision making (see chart for details).    MDM Rules/Calculators/A&P                       Patient presents with hyperglycemia, nausea, vomiting.  He is tachycardic and mildly tachypneic on exam.  Given recent history of hyperglycemia and ongoing vomiting, question diabetic ketoacidosis.  Reports that he has been taking his medications.  He is on Metformin, and glipizide.  Patient given fluids.  CBG greater than 600.  Patient describes blood streaks in his emesis.  Suspect this is likely related to Mallory-Weiss tear but will monitor closely.  Chest x-ray without any evidence of pneumothorax or pneumonia.  EKG with sinus tachycardia without arrhythmia or ischemic changes.  Work-up notable for glucose of 806, sodium 124, creatinine 2.71 which is an acute increase from prior, anion gap of 30.  This is highly indicative of DKA.  Additionally, patient with leukocytosis to 20.  History of the same.  Hemoglobin is 15.2.  Doubt acute intra-abdominal bleeding or GI bleed given normal hemoglobin.  Covid testing pending.  VBG with a pH of 7.279, CO2 of 35, bicarb of 17.  Patient was started on Endo tool.  He was given 2 L of fluid.  We will plan for admission for  DKA.  Final Clinical Impression(s) / ED Diagnoses Final  diagnoses:  Diabetic ketoacidosis without coma associated with type 2 diabetes mellitus ([place])  AKI (acute kidney injury) ([place])  Mallory-Weiss syndrome    Rx / DC Orders ED Discharge Orders    None       Jazara Swiney, Barbette Hair, MD 03/15/20 9927    Merryl Hacker, MD 03/15/20 [phone]

## 2020-03-15 NOTE — H&P (Signed)
History and Physical    Michael [name] EGB:[id number] DOB: 09/15/1996 DOA: 03/14/2020  PCP: Kerin Perna, NP  Patient coming from: Home.  Chief Complaint: Vomiting and chest pain.  HPI: Michael [name] is a 24 y.o. male with history of diabetes mellitus type 2 was admitted this year in January with DKA with new onset diabetes states he has been throwing up and having chest pain for the last 4 to 5 days.  Chest pain is mostly in the left side burning sensation worsened after he threw up.  Patient had gone to meet his mother about 2 weeks ago when he missed his medicine for almost a week.  Following which he has been taking his medicine regularly including the insulin.  Last 3 to 4 days patient has been throwing up and eventually started becoming bloody with chest pain and mild shortness of breath.  At this point patient decided to come to the ER.  No fever or chills denies any abdominal pain or diarrhea.  ED Course: In the ER patient is hemodynamically stable EKG showing sinus tachycardia blood pressure was in the low normal labs show sodium 124 blood glucose greater than 6 calcium 10.9 anion gap 30 WBC 21.5 extensive troponin was 3 chest x-ray unremarkable Covid test negative.  UA shows ketones.  Patient started on [name] insulin and fluids for DKA and admitted for further management.  Review of Systems: As per HPI, rest all negative.   Past Medical History:  Diagnosis Date  . Diabetes mellitus without complication ([place])     History reviewed. No pertinent surgical history.   reports that he has never smoked. He has never used smokeless tobacco. He reports previous alcohol use. He reports previous drug use.  No Known Allergies  Family History  Problem Relation Age of Onset  . Sudden death Neg Hx   . Heart attack Neg Hx     Prior to Admission medications   Medication Sig Start Date End Date Taking? Authorizing Provider  blood glucose meter kit and supplies Dispense based on patient  and insurance preference. Use up to four times daily as directed. (FOR ICD-9 250.00, 250.01). 10/25/19   Oswald Hillock, MD  glipiZIDE (GLUCOTROL) 10 MG tablet Take 1 tablet (10 mg total) by mouth 2 (two) times daily before a meal. 11/18/19   Kerin Perna, NP  insulin glargine (LANTUS) 100 UNIT/ML injection Inject 0.3 mLs (30 Units total) into the skin daily. 10/26/19   Oswald Hillock, MD  Insulin Syringe-Needle U-100 (INSULIN SYRINGE .5CC/31GX5/16") 31G X 5/16" 0.5 ML MISC Use as directed 10/25/19   Oswald Hillock, MD  lisinopril (ZESTRIL) 20 MG tablet Take 1 tablet (20 mg total) by mouth daily. 11/18/19   Kerin Perna, NP  metFORMIN (GLUCOPHAGE) 1000 MG tablet Take 1 tablet (1,000 mg total) by mouth 2 (two) times daily with a meal. 11/18/19   Kerin Perna, NP  metoprolol tartrate (LOPRESSOR) 25 MG tablet Take 0.5 tablets (12.5 mg total) by mouth 2 (two) times daily. 11/18/19 02/16/20  Kerin Perna, NP  pravastatin (PRAVACHOL) 20 MG tablet Take 1 tablet (20 mg total) by mouth daily. 11/18/19   Kerin Perna, NP    Physical Exam: Constitutional: Moderately built and nourished. Vitals:   03/15/20 0200 03/15/20 0300 03/15/20 0301 03/15/20 0330  BP: 106/70 (!) 107/53  (!) 104/57  Pulse: (!) 123  (!) 127 (!) 129  Resp: (!) 21 (!) 30 (!) 21 18  Temp:      TempSrc:      SpO2: 98%  97% 100%  Weight:      Height:       Eyes: Anicteric no pallor. ENMT: No discharge from the ears eyes nose or mouth. Neck: No mass felt.  No neck rigidity. Respiratory: No rhonchi or crepitations. Cardiovascular: S1-S2 heard. Abdomen: Soft nontender bowel sounds present. Musculoskeletal: No edema. Skin: No rash. Neurologic: Alert awake oriented to time place and person.  Moves all extremities. Psychiatric: Appears normal.   Labs on Admission: I have personally reviewed following labs and imaging studies  CBC: Recent Labs  Lab 03/15/20 0035 03/15/20 0146  WBC 21.5*  --   NEUTROABS 17.8*   --   HGB 15.2 17.7*  HCT 47.3 52.0  MCV 77.5*  --   PLT 422*  --    Basic Metabolic Panel: Recent Labs  Lab 03/15/20 0035 03/15/20 0146 03/15/20 0310  NA 124* 123* 127*  K 5.4* 5.4* 5.1  CL 80*  --  86*  CO2 14*  --  13*  GLUCOSE 806*  --  748*  BUN 65*  --  63*  CREATININE 2.71*  --  2.70*  CALCIUM 10.9*  --  10.0   GFR: Estimated Creatinine Clearance: 76.2 mL/min (A) (by C-G formula based on SCr of 2.7 mg/dL (H)). Liver Function Tests: Recent Labs  Lab 03/15/20 0035  AST 19  ALT 24  ALKPHOS 91  BILITOT 2.1*  PROT 8.5*  ALBUMIN 4.4   Recent Labs  Lab 03/15/20 0035  LIPASE 23   No results for input(s): AMMONIA in the last 168 hours. Coagulation Profile: No results for input(s): INR, PROTIME in the last 168 hours. Cardiac Enzymes: No results for input(s): CKTOTAL, CKMB, CKMBINDEX, TROPONINI in the last 168 hours. BNP (last 3 results) No results for input(s): PROBNP in the last 8760 hours. HbA1C: No results for input(s): HGBA1C in the last 72 hours. CBG: Recent Labs  Lab 03/15/20 0105 03/15/20 0258  GLUCAP >600* >600*   Lipid Profile: No results for input(s): CHOL, HDL, LDLCALC, TRIG, CHOLHDL, LDLDIRECT in the last 72 hours. Thyroid Function Tests: No results for input(s): TSH, T4TOTAL, FREET4, T3FREE, THYROIDAB in the last 72 hours. Anemia Panel: No results for input(s): VITAMINB12, FOLATE, FERRITIN, TIBC, IRON, RETICCTPCT in the last 72 hours. Urine analysis:    Component Value Date/Time   COLORURINE YELLOW 03/15/2020 0157   APPEARANCEUR CLOUDY (A) 03/15/2020 0157   LABSPEC 1.020 03/15/2020 0157   PHURINE 5.5 03/15/2020 0157   GLUCOSEU >=500 (A) 03/15/2020 0157   HGBUR TRACE (A) 03/15/2020 0157   BILIRUBINUR MODERATE (A) 03/15/2020 0157   KETONESUR >80 (A) 03/15/2020 0157   PROTEINUR 30 (A) 03/15/2020 0157   UROBILINOGEN 0.2 10/21/2019 1258   NITRITE NEGATIVE 03/15/2020 0157   LEUKOCYTESUR NEGATIVE 03/15/2020 0157   Sepsis  Labs: @LABRCNTIP (procalcitonin:4,lacticidven:4) ) Recent Results (from the past 240 hour(s))  SARS Coronavirus 2 by RT PCR (hospital order, performed in [facility] hospital lab) Nasopharyngeal Nasopharyngeal Swab     Status: None   Collection Time: 03/15/20  1:45 AM   Specimen: Nasopharyngeal Swab  Result Value Ref Range Status   SARS Coronavirus 2 NEGATIVE NEGATIVE Final    Comment: (NOTE) SARS-CoV-2 target nucleic acids are NOT DETECTED. The SARS-CoV-2 RNA is generally detectable in upper and lower respiratory specimens during the acute phase of infection. The lowest concentration of SARS-CoV-2 viral copies this assay can detect is 250 copies / mL. A negative  result does not preclude SARS-CoV-2 infection and should not be used as the sole basis for treatment or other patient management decisions.  A negative result may occur with improper specimen collection / handling, submission of specimen other than nasopharyngeal swab, presence of viral mutation(s) within the areas targeted by this assay, and inadequate number of viral copies (<250 copies / mL). A negative result must be combined with clinical observations, patient history, and epidemiological information. Fact Sheet for Patients:   StrictlyIdeas.no Fact Sheet for Healthcare Providers: BankingDealers.co.za This test is not yet approved or cleared  by the [place] FDA and has been authorized for detection and/or diagnosis of SARS-CoV-2 by FDA under an Emergency Use Authorization (EUA).  This EUA will remain in effect (meaning this test can be used) for the duration of the COVID-19 declaration under Section 564(b)(1) of the Act, 21 U.S.C. section 360bbb-3(b)(1), unless the authorization is terminated or revoked sooner. Performed at [facility], [address] [place], [place] [address]      Radiological Exams on Admission: DG Chest Portable 1 View  Result  Date: 03/15/2020 CLINICAL DATA:  Chest pain.  Cough.  Shortness of breath. EXAM: PORTABLE CHEST 1 VIEW COMPARISON:  None. FINDINGS: The cardiomediastinal contours are normal. The lungs are clear. Pulmonary vasculature is normal. No consolidation, pleural effusion, or pneumothorax. No acute osseous abnormalities are seen. Large habitus. IMPRESSION: Negative portable AP view of the chest. Electronically Signed   By: Keith Rake M.D.   On: 03/15/2020 00:43    EKG: Independently reviewed.  Sinus tachycardia.  Nonspecific T wave changes.  Assessment/Plan Principal Problem:   DKA (diabetic ketoacidoses) (HCC) Active Problems:   AKI (acute kidney injury) ([place])   Nausea & vomiting    1. Diabetic ketoacidosis could have been precipitated by patient recently missing his medication when he went to visit his mom.  Check hemoglobin A1c presently patient is on [name] insulin and fluids follow metabolic panel closely. 2. Nausea vomiting with hematemesis -could be Mallory-Weiss tear.  We will keep patient n.p.o. [name] Protonix.  Follow CBC closely.  May need GI consult if there is significant fall in hemoglobin. 3. Acute renal failure likely from vomiting and dehydration.  I think will improve with hydration.  4. Hyponatremia likely from hyperglycemia which I think will improve with correction of glucose. 5. Chest pain I think is from vomiting and could also be from Mallory-Weiss tear.  However since patient does have a history of diabetes and other risk factors will check cardiac markers. 6. Leukocytosis could be reactionary.  Patient is afebrile.  Chest x-ray does not show any infiltrate UA is unremarkable.  Closely monitor. 7. Morbid obesity will need nutrition counseling. 8. History of hypertension presently blood pressures in the low normal.  Closely monitor. 9. History of hyperlipidemia.  Since patient has significant DKA with chest pain and nausea vomiting with possible GI bleed will need close monitoring  and inpatient status.   DVT prophylaxis: SCDs.  Avoiding pharmacological DVT prophylaxis due to GI bleed. Code Status: Full code. Family Communication: Discussed with patient. Disposition Plan: Home. Consults called: None. Admission status: Inpatient.   Rise Patience MD [facility] Pager [phone].  If 7PM-7AM, please contact night-coverage [email or web address] Password [facility]  03/15/2020, 4:43 AM

## 2020-03-15 NOTE — Progress Notes (Signed)
Patient seen and examined. States hasnt taken insulin in over a month. Admitted for DKA. BG improved to 200s. AKI slowly improving. Last BMP at 5 pm still has some acidosis although could be related to renal failure as well. Anion gap indicates DKA though. Continue insulin drip for tonight. Start diabetic diet. Patient was on Lantus 30 units qdaily along with oral hypoglycemics previously (although he says he was taking 70 units ) . Can transition to Lantus 30 units if and when gap closes 1 hr before discontinuing drip. Continue IVF

## 2020-03-15 NOTE — ED Notes (Signed)
Dr. Wilkie Aye aware that blood sugar is 806.

## 2020-03-15 NOTE — ED Notes (Signed)
Primary nurse aware of blood sugar 748. Dr. Wilkie Aye aware of blood sugar

## 2020-03-15 NOTE — TOC Initial Note (Signed)
Transition of Care [facility]) - Initial/Assessment Note    Patient Details  Name: Michael [name] MRN: [id number] Date of Birth: 11/10/1995  [facility] CM/SW Contact:    Golda Acre, RN Phone Number: 03/15/2020, 10:07 AM  Clinical Narrative:                 Nausea and vomiting with hx of diabetes.  Expected Discharge Plan: Home/Self Care Barriers to Discharge: Continued Medical Work up   Patient Goals and CMS Choice Patient states their goals for this hospitalization and ongoing recovery are:: to go home and stay well CMS Medicare.gov Compare Post Acute Care list provided to:: Patient    Expected Discharge Plan and Services Expected Discharge Plan: Home/Self Care   Discharge Planning Services: CM Consult   Living arrangements for the past 2 months: Apartment                                      Prior Living Arrangements/Services Living arrangements for the past 2 months: Apartment Lives with:: Self Patient language and need for interpreter reviewed:: No Do you feel safe going back to the place where you live?: Yes      Need for Family Participation in Patient Care: No (Comment) Care giver support system in place?: No (comment)   Criminal Activity/Legal Involvement Pertinent to Current Situation/Hospitalization: No - Comment as needed  Activities of Daily Living Home Assistive Devices/Equipment: None ADL Screening (condition at time of admission) Patient's cognitive ability adequate to safely complete daily activities?: Yes Is the patient deaf or have difficulty hearing?: No Does the patient have difficulty seeing, even when wearing glasses/contacts?: No Does the patient have difficulty concentrating, remembering, or making decisions?: No Patient able to express need for assistance with ADLs?: Yes Does the patient have difficulty dressing or bathing?: No Independently performs ADLs?: Yes (appropriate for developmental age) Does the patient have  difficulty walking or climbing stairs?: No Weakness of Legs: Both Weakness of Arms/Hands: None  Permission Sought/Granted                  Emotional Assessment Appearance:: Appears stated age     Orientation: : Oriented to Self, Oriented to Place, Oriented to  Time, Oriented to Situation Alcohol / Substance Use: Not Applicable Psych Involvement: No (comment)  Admission diagnosis:  Mallory-Weiss syndrome [K22.6] DKA (diabetic ketoacidoses) (HCC) [E11.10] AKI (acute kidney injury) (HCC) [N17.9] Diabetic ketoacidosis without coma associated with type 2 diabetes mellitus (HCC) [E11.10] Patient Active Problem List   Diagnosis Date Noted  . AKI (acute kidney injury) (HCC) 03/15/2020  . Nausea & vomiting 03/15/2020  . DKA (diabetic ketoacidoses) (HCC) 10/21/2019  . Sports physical 08/12/2012   PCP:  Grayce Sessions, NP Pharmacy:   [facility] [phone] Ginette Otto, [place] - [address] AT [facility] OF [facility] [address] [place] [address] Phone: [phone] Fax: [phone]     Social Determinants of Health (SDOH) Interventions    Readmission Risk Interventions No flowsheet data found.

## 2020-03-15 NOTE — ED Notes (Signed)
Pt left shoes; attempted to call pt contact and pt cell phone, left a message on pt cell phone telling to call back; pt shoes placed in belonging bag with label and placed in locked cabinet above respiratory

## 2020-03-15 NOTE — ED Notes (Signed)
[facility] notified (Tammy) - patient ready for transport 

## 2020-03-15 NOTE — Progress Notes (Signed)
Inpatient Diabetes Program Recommendations  AACE/ADA: New Consensus Statement on Inpatient Glycemic Control (2015)  Target Ranges:  Prepandial:   less than 140 mg/dL      Peak postprandial:   less than 180 mg/dL (1-2 hours)      Critically ill patients:  140 - 180 mg/dL   Lab Results  Component Value Date   GLUCAP 219 (H) 03/15/2020   HGBA1C 10.0 (H) 10/22/2019   LATE NOTE  Review of Glycemic Control  Diabetes history: DM2 Outpatient Diabetes medications: glipizide 10 mg bid, metformin 1000 mg bid, not taking Lantus 30 units QD Current orders for Inpatient glycemic control: IV insulin per [facility] for DKA.   HgbA1C - 10.0% - needs tighter control AG - 20   CO2 - 21 Continue on drip until criteria met for discontinuation.  Spoke with pt briefly on 6/7 and he was groggy and falling asleep. Will see on 6/8 am.  Inpatient Diabetes Program Recommendations:   (when ready to transition)  Give Lantus 1-2 hours prior to discontinuation of insulin drip.  Lantus 25 units bid Novolog 0-20 units tidwc and hs Novolog 10 units tidwc for meal coverage insulin.  Titrate insulin daily until goal of 140-180 mg/dL is achieved.   Follow closely.  Thank you. Lorenda Peck, RD, LDN, CDE Inpatient Diabetes Coordinator [phone]

## 2020-03-15 NOTE — ED Notes (Addendum)
Attempted [place] IV in Right AC, unable to advance catheter but able to get blood. Second staff member to attempt.

## 2020-03-15 NOTE — Progress Notes (Signed)
Pt has small amount of clear emesis with streaks of dark blood, Dr Toniann Fail aware.

## 2020-03-16 DIAGNOSIS — E876 Hypokalemia: Secondary | ICD-10-CM

## 2020-03-16 DIAGNOSIS — E1310 Other specified diabetes mellitus with ketoacidosis without coma: Secondary | ICD-10-CM

## 2020-03-16 DIAGNOSIS — K920 Hematemesis: Secondary | ICD-10-CM

## 2020-03-16 LAB — GLUCOSE, CAPILLARY
Glucose-Capillary: 161 mg/dL — ABNORMAL HIGH (ref 70–99)
Glucose-Capillary: 161 mg/dL — ABNORMAL HIGH (ref 70–99)
Glucose-Capillary: 166 mg/dL — ABNORMAL HIGH (ref 70–99)
Glucose-Capillary: 168 mg/dL — ABNORMAL HIGH (ref 70–99)
Glucose-Capillary: 169 mg/dL — ABNORMAL HIGH (ref 70–99)
Glucose-Capillary: 169 mg/dL — ABNORMAL HIGH (ref 70–99)
Glucose-Capillary: 173 mg/dL — ABNORMAL HIGH (ref 70–99)
Glucose-Capillary: 176 mg/dL — ABNORMAL HIGH (ref 70–99)
Glucose-Capillary: 181 mg/dL — ABNORMAL HIGH (ref 70–99)
Glucose-Capillary: 184 mg/dL — ABNORMAL HIGH (ref 70–99)
Glucose-Capillary: 188 mg/dL — ABNORMAL HIGH (ref 70–99)
Glucose-Capillary: 189 mg/dL — ABNORMAL HIGH (ref 70–99)
Glucose-Capillary: 191 mg/dL — ABNORMAL HIGH (ref 70–99)
Glucose-Capillary: 191 mg/dL — ABNORMAL HIGH (ref 70–99)
Glucose-Capillary: 192 mg/dL — ABNORMAL HIGH (ref 70–99)
Glucose-Capillary: 195 mg/dL — ABNORMAL HIGH (ref 70–99)
Glucose-Capillary: 237 mg/dL — ABNORMAL HIGH (ref 70–99)
Glucose-Capillary: 253 mg/dL — ABNORMAL HIGH (ref 70–99)
Glucose-Capillary: 253 mg/dL — ABNORMAL HIGH (ref 70–99)
Glucose-Capillary: 270 mg/dL — ABNORMAL HIGH (ref 70–99)

## 2020-03-16 LAB — BASIC METABOLIC PANEL
Anion gap: 15 (ref 5–15)
Anion gap: 16 — ABNORMAL HIGH (ref 5–15)
BUN: 49 mg/dL — ABNORMAL HIGH (ref 6–20)
BUN: 48 mg/dL — ABNORMAL HIGH (ref 6–20)
CO2: 20 mmol/L — ABNORMAL LOW (ref 22–32)
CO2: 23 mmol/L (ref 22–32)
Calcium: 9.1 mg/dL (ref 8.9–10.3)
Calcium: 9.7 mg/dL (ref 8.9–10.3)
Chloride: 101 mmol/L (ref 98–111)
Chloride: 98 mmol/L (ref 98–111)
Creatinine, Ser: 2.11 mg/dL — ABNORMAL HIGH (ref 0.61–1.24)
Creatinine, Ser: 2.18 mg/dL — ABNORMAL HIGH (ref 0.61–1.24)
GFR calc Af Amer: 50 mL/min — ABNORMAL LOW (ref >60)
GFR calc Af Amer: 48 mL/min — ABNORMAL LOW (ref >60)
GFR calc non Af Amer: 43 mL/min — ABNORMAL LOW (ref >60)
GFR calc non Af Amer: 41 mL/min — ABNORMAL LOW (ref >60)
Glucose, Bld: 195 mg/dL — ABNORMAL HIGH (ref 70–99)
Glucose, Bld: 169 mg/dL — ABNORMAL HIGH (ref 70–99)
Potassium: 3.3 mmol/L — ABNORMAL LOW (ref 3.5–5.1)
Potassium: 3.5 mmol/L (ref 3.5–5.1)
Sodium: 136 mmol/L (ref 135–145)
Sodium: 137 mmol/L (ref 135–145)

## 2020-03-16 LAB — HEMOGLOBIN A1C
Hgb A1c MFr Bld: 11.4 % — ABNORMAL HIGH (ref 4.8–5.6)
Mean Plasma Glucose: 280.48 mg/dL

## 2020-03-16 LAB — BETA-HYDROXYBUTYRIC ACID: Beta-Hydroxybutyric Acid: 3.04 mmol/L — ABNORMAL HIGH (ref 0.05–0.27)

## 2020-03-16 MED ORDER — INSULIN ASPART PROT & ASPART (70-30 MIX) 100 UNIT/ML ~~LOC~~ SUSP
25.0000 [IU] | Freq: Two times a day (BID) | SUBCUTANEOUS | Status: DC
  Administered 2020-03-16 – 2020-03-17 (×2): 25 [IU] via SUBCUTANEOUS
  Filled 2020-03-16: qty 10

## 2020-03-16 MED ORDER — INSULIN ASPART 100 UNIT/ML ~~LOC~~ SOLN
0.0000 [IU] | Freq: Every day | SUBCUTANEOUS | Status: DC
  Administered 2020-03-16: 3 [IU] via SUBCUTANEOUS

## 2020-03-16 MED ORDER — SUCRALFATE 1 G PO TABS
1.0000 g | ORAL_TABLET | Freq: Three times a day (TID) | ORAL | Status: DC
  Administered 2020-03-16 – 2020-03-19 (×10): 1 g via ORAL
  Filled 2020-03-16 (×11): qty 1

## 2020-03-16 MED ORDER — PHENOL 1.4 % MT LIQD
1.0000 | OROMUCOSAL | Status: DC | PRN
  Administered 2020-03-16: 1 via OROMUCOSAL
  Filled 2020-03-16: qty 177

## 2020-03-16 MED ORDER — PANTOPRAZOLE SODIUM 40 MG PO TBEC
40.0000 mg | DELAYED_RELEASE_TABLET | Freq: Every day | ORAL | Status: DC
  Administered 2020-03-17: 40 mg via ORAL
  Filled 2020-03-16: qty 1

## 2020-03-16 MED ORDER — INSULIN ASPART 100 UNIT/ML ~~LOC~~ SOLN
0.0000 [IU] | Freq: Three times a day (TID) | SUBCUTANEOUS | Status: DC
  Administered 2020-03-17: 7 [IU] via SUBCUTANEOUS
  Administered 2020-03-17 (×2): 15 [IU] via SUBCUTANEOUS
  Administered 2020-03-18: 4 [IU] via SUBCUTANEOUS
  Administered 2020-03-18: 11 [IU] via SUBCUTANEOUS
  Administered 2020-03-18: 7 [IU] via SUBCUTANEOUS
  Administered 2020-03-19 (×2): 4 [IU] via SUBCUTANEOUS

## 2020-03-16 MED ORDER — METOPROLOL TARTRATE 25 MG PO TABS
25.0000 mg | ORAL_TABLET | Freq: Two times a day (BID) | ORAL | Status: DC
  Administered 2020-03-16 – 2020-03-19 (×6): 25 mg via ORAL
  Filled 2020-03-16 (×6): qty 1

## 2020-03-16 MED ORDER — CALCIUM CARBONATE ANTACID 500 MG PO CHEW: 1.0000 | CHEWABLE_TABLET | ORAL | Status: DC | PRN

## 2020-03-16 MED ORDER — KCL-LACTATED RINGERS 20 MEQ/L IV SOLN
INTRAVENOUS | Status: DC
  Filled 2020-03-16 (×3): qty 1000

## 2020-03-16 MED ORDER — LIP MEDEX EX OINT
TOPICAL_OINTMENT | CUTANEOUS | Status: DC | PRN
  Administered 2020-03-16: 1 via TOPICAL
  Filled 2020-03-16: qty 7

## 2020-03-16 MED ORDER — INSULIN STARTER KIT- PEN NEEDLES (ENGLISH)
1.0000 | Freq: Once | Status: DC
  Filled 2020-03-16: qty 1

## 2020-03-16 MED ORDER — MAGIC MOUTHWASH W/LIDOCAINE
5.0000 mL | Freq: Three times a day (TID) | ORAL | Status: DC | PRN
  Administered 2020-03-16 (×2): 5 mL via ORAL
  Filled 2020-03-16 (×3): qty 5

## 2020-03-16 MED ORDER — PRAVASTATIN SODIUM 20 MG PO TABS
20.0000 mg | ORAL_TABLET | Freq: Every day | ORAL | Status: DC
  Administered 2020-03-17 – 2020-03-18 (×2): 20 mg via ORAL
  Filled 2020-03-16 (×2): qty 1

## 2020-03-16 MED ORDER — LIVING WELL WITH DIABETES BOOK
Freq: Once | Status: AC
  Filled 2020-03-16: qty 1

## 2020-03-16 MED ORDER — POTASSIUM CHLORIDE 20 MEQ PO PACK
40.0000 meq | PACK | Freq: Once | ORAL | Status: AC
  Administered 2020-03-16: 40 meq via ORAL
  Filled 2020-03-16: qty 2

## 2020-03-16 NOTE — Progress Notes (Signed)
Per [facility], pt can receive SQ lantus and begin transition to SQ insulin. Dr. Arville Care notified by this RN; verbal orders received to continue with insulin gtt and hold off on lantus until AG closes. Current Anion Gap is 20. This RN will continue to titrate insulin gtt per [facility] guidelines and monitor BMP results.

## 2020-03-16 NOTE — Progress Notes (Signed)

## 2020-03-16 NOTE — Progress Notes (Signed)
At around 0300, lab was unable get Q4H BMP, and told this RN that another phlebotomist would try to stick pt shortly. At 0500 another BMP was overdue. This RN called phlebotomy and was told lab would be up shortly. This RN will continue to monitor pt.

## 2020-03-16 NOTE — Progress Notes (Addendum)
PROGRESS NOTE    Michael [name]  GNO:[id number]  DOB: 10/07/1996  PCP: Kerin Perna, NP  Admit date:03/14/2020  24 y.o. male with history of diabetes mellitus type 2 was admitted this year in January with DKA with new onset diabetes states he has been throwing up and having burning chest pain for the last 4 to 5 days. Has not been taking insulin for >1 month--does not remember the dose of insulin he was supposed to be on. ED Course: Afebrile,hemodynamically stable EKG showing sinus tachycardia blood pressure was in the low normal labs show sodium 124 blood glucose greater than 6 calcium 10.9 anion gap 30 WBC 21.5 extensive troponin was 3 chest x-ray unremarkable Covid test negative.  UA shows ketones.   Hospital course: Patient admitted to [facility] with [name] insulin and [name] fluids for DKA management.  Subjective: Patient sitting at the side of the bed and appears more alert, comfortable today.  Objective: Vitals:   03/16/20 1200 03/16/20 1300 03/16/20 1400 03/16/20 1600  BP: (!) 145/88     Pulse: (!) 101 (!) 120 (!) 109   Resp: 16  (!) 26   Temp: 98.1 F (36.7 C)   97.7 F (36.5 C)  TempSrc: Oral   Oral  SpO2: 94% 99% 99%   Weight:      Height:        Intake/Output Summary (Last 24 hours) at 03/16/2020 1808 Last data filed at 03/16/2020 1400 Gross per 24 hour  Intake 2143.28 ml  Output 2375 ml  Net -231.72 ml   Filed Weights   03/14/20 2329 03/15/20 0449  Weight: (!) 190.5 kg (!) 201.4 kg    Physical Examination:  General exam: Morbidly obese male who appears more alert today.  No acute distress.   Respiratory system: Clear to auscultation. Respiratory effort normal. Cardiovascular system: S1 & S2 heard, RRR. No JVD, murmurs, rubs, gallops or clicks. No pedal edema. Gastrointestinal system: Abdomen is nondistended, soft and mild epigastric tenderness. Normal bowel sounds heard. Central nervous system: Alert and oriented. No new focal neurological deficits. Extremities: No  contractures, edema or joint deformities.  Skin: No rashes, lesions or ulcers Psychiatry: Judgement and insight appear sub-normal. Mood & affect appropriate.   Data Reviewed: I have personally reviewed following labs and imaging studies  CBC: Recent Labs  Lab 03/15/20 0035 03/15/20 0146 03/15/20 0714  WBC 21.5*  --  22.0*  21.3*  NEUTROABS 17.8*  --   --   HGB 15.2 17.7* 14.8  14.6  HCT 47.3 52.0 46.0  45.0  MCV 77.5*  --  79.9*  77.6*  PLT 422*  --  343  169   Basic Metabolic Panel: Recent Labs  Lab 03/15/20 1325 03/15/20 1653 03/15/20 2041 03/16/20 0713 03/16/20 1421  NA 138 137 138 136 137  K 4.6 4.6 4.6 3.3* 3.5  CL 97* 101 97* 101 98  CO2 20* 18* 21* 20* 23  GLUCOSE 231* 219* 180* 195* 169*  BUN 72* 64* 69* 49* 48*  CREATININE 3.38* 3.19* 3.03* 2.11* 2.18*  CALCIUM 10.3 10.1 10.5* 9.1 9.7   GFR: Estimated Creatinine Clearance: 97.7 mL/min (A) (by C-G formula based on SCr of 2.18 mg/dL (H)). Liver Function Tests: Recent Labs  Lab 03/15/20 0035  AST 19  ALT 24  ALKPHOS 91  BILITOT 2.1*  PROT 8.5*  ALBUMIN 4.4   Recent Labs  Lab 03/15/20 0035  LIPASE 23   No results for input(s): AMMONIA in the last 168 hours.  Coagulation Profile: No results for input(s): INR, PROTIME in the last 168 hours. Cardiac Enzymes: No results for input(s): CKTOTAL, CKMB, CKMBINDEX, TROPONINI in the last 168 hours. BNP (last 3 results) No results for input(s): PROBNP in the last 8760 hours. HbA1C: Recent Labs    03/15/20 0714  HGBA1C 11.4*   CBG: Recent Labs  Lab 03/16/20 1408 03/16/20 1530 03/16/20 1632 03/16/20 1657 03/16/20 1734  GLUCAP 189* 181* 169* 237* 253*   Lipid Profile: No results for input(s): CHOL, HDL, LDLCALC, TRIG, CHOLHDL, LDLDIRECT in the last 72 hours. Thyroid Function Tests: No results for input(s): TSH, T4TOTAL, FREET4, T3FREE, THYROIDAB in the last 72 hours. Anemia Panel: No results for input(s): VITAMINB12, FOLATE, FERRITIN, TIBC,  IRON, RETICCTPCT in the last 72 hours. Sepsis Labs: No results for input(s): PROCALCITON, LATICACIDVEN in the last 168 hours.  Recent Results (from the past 240 hour(s))  SARS Coronavirus 2 by RT PCR (hospital order, performed in [facility] hospital lab) Nasopharyngeal Nasopharyngeal Swab     Status: None   Collection Time: 03/15/20  1:45 AM   Specimen: Nasopharyngeal Swab  Result Value Ref Range Status   SARS Coronavirus 2 NEGATIVE NEGATIVE Final    Comment: (NOTE) SARS-CoV-2 target nucleic acids are NOT DETECTED. The SARS-CoV-2 RNA is generally detectable in upper and lower respiratory specimens during the acute phase of infection. The lowest concentration of SARS-CoV-2 viral copies this assay can detect is 250 copies / mL. A negative result does not preclude SARS-CoV-2 infection and should not be used as the sole basis for treatment or other patient management decisions.  A negative result may occur with improper specimen collection / handling, submission of specimen other than nasopharyngeal swab, presence of viral mutation(s) within the areas targeted by this assay, and inadequate number of viral copies (<250 copies / mL). A negative result must be combined with clinical observations, patient history, and epidemiological information. Fact Sheet for Patients:   StrictlyIdeas.no Fact Sheet for Healthcare Providers: BankingDealers.co.za This test is not yet approved or cleared  by the [place] FDA and has been authorized for detection and/or diagnosis of SARS-CoV-2 by FDA under an Emergency Use Authorization (EUA).  This EUA will remain in effect (meaning this test can be used) for the duration of the COVID-19 declaration under Section 564(b)(1) of the Act, 21 U.S.C. section 360bbb-3(b)(1), unless the authorization is terminated or revoked sooner. Performed at [facility], [address] [place], [place] [address]    MRSA PCR Screening     Status: None   Collection Time: 03/15/20  4:32 AM   Specimen: Nasal Mucosa; Nasopharyngeal  Result Value Ref Range Status   MRSA by PCR NEGATIVE NEGATIVE Final    Comment:        The GeneXpert MRSA Assay (FDA approved for NASAL specimens only), is one component of a comprehensive MRSA colonization surveillance program. It is not intended to diagnose MRSA infection nor to guide or monitor treatment for MRSA infections. Performed at [facility], [place] [address] [place], [place] [address]       Radiology Studies: DG Chest Portable 1 View  Result Date: 03/15/2020 CLINICAL DATA:  Chest pain.  Cough.  Shortness of breath. EXAM: PORTABLE CHEST 1 VIEW COMPARISON:  None. FINDINGS: The cardiomediastinal contours are normal. The lungs are clear. Pulmonary vasculature is normal. No consolidation, pleural effusion, or pneumothorax. No acute osseous abnormalities are seen. Large habitus. IMPRESSION: Negative portable AP view of the chest. Electronically Signed  By: Keith Rake M.D.   On: 03/15/2020 00:43        Scheduled Meds: . Chlorhexidine Gluconate Cloth  6 each Topical Daily  . insulin aspart protamine- aspart  25 Units Subcutaneous BID WC  . insulin starter kit- pen needles  1 kit Other Once  . mouth rinse  15 mL Mouth Rinse BID  . [START ON 03/17/2020] pantoprazole  40 mg Oral Daily  . sucralfate  1 g Oral TID WC & HS   Continuous Infusions: . sodium chloride Stopped (03/15/20 1226)     Assessment/Plan: 1. Diabetic ketoacidosis : Remained on insulin drip for greater than 24 hours.  Serial BMPs showing gradual improvement in acidosis and anion gap slowly closing.  Bicarb at 23 on labs this afternoon.  Patient states he was not taking Lantus insulin as could not afford.  Per record he was to be on 30 units of Lantus daily.  He was also on oral hypoglycemics.  Hemoglobin A1c at 11.4.  Seen by diabetes educator who recommended  transitioning to 70/30 insulin 25 units twice daily and monitoring.  Patient now on diabetic diet.  Can DC insulin drip 1 hour after subcu insulin.  Will also add sliding scale insulin.  Patient educated on the need for compliance with diet and insulin regimen.  Will need medication assistance at the time of discharge. 2. Nausea vomiting with hematemesis -could be Mallory-Weiss tear.  Improved with. [name] Protonix.  Hemoglobin stable.  Complained of scratchy throat and ordered Magic mouthwash.  Still has some burning sensation while swallowing, added sucralfate.   3. Acute renal failure likely from vomiting and dehydration.  Patient baseline creatinine appears to be around 1.2.   Creatinine peaked to 3.38 in this admission and now improving slowly with [name] hydration.  Continue [name] fluids for another day (will change dextrose fluids back to nondextrose as coming off insulin drip) 4. Hyponatremia likely from hyperglycemia which I think will improve with correction of glucose. 5. Atypical chest pain: Now resolved.  Likely secondary to dyspepsia and esophagitis.  Added sucralfate. 6. Leukocytosis: Likely reactive in the setting of nausea vomiting..  Patient is afebrile.  Chest x-ray does not show any infiltrate UA is unremarkable.  Nontoxic appearing.  Defer antibiotics for now. 7. Morbid obesity: counseled regarding weight loss. Dietician consult 8. History of hypertension presently blood pressures low normal on presentation, now slowly going up.  Resume home meds slowly 9. History of hyperlipidemia.  Resume home meds  DVT prophylaxis: Chemoprophylaxis deferred due to hematemesis Code Status: Full code Family / Patient Communication: Discussed with patient.  Tried to call and update mother at listed number--could not reach, no message box. Disposition Plan:   Status is: Inpatient  Remains inpatient appropriate because:[name] treatments appropriate due to intensity of illness or inability to take PO   Dispo:  The patient is from: Home              Anticipated d/c is to: Home              Anticipated d/c date is: 1 day              Patient currently is not medically stable to d/c.     LOS: 1 day    Time spent:     Guilford Shi, MD [facility] Pager in [place]  If 7PM-7AM, please contact night-coverage [email or web address] 03/16/2020, 6:08 PM

## 2020-03-16 NOTE — Progress Notes (Signed)
Inpatient Diabetes Program Recommendations  AACE/ADA: New Consensus Statement on Inpatient Glycemic Control (2015)  Target Ranges:  Prepandial:   less than 140 mg/dL      Peak postprandial:   less than 180 mg/dL (1-2 hours)      Critically ill patients:  140 - 180 mg/dL   Lab Results  Component Value Date   GLUCAP 189 (H) 03/16/2020   HGBA1C 11.4 (H) 03/15/2020    Review of Glycemic Control  Spoke with pt about his diabetes and HgbA1C of 10%. Pt states he could not afford Lantus 30 units QD when he was discharged from hospital back in Jan 2021. Followed up with [facility] on 11/18/19 and was started on glipizide 10 mg bid and metformin increased to 1000 mg bid. Has meter to check blood sugars at home. Explained what HgbA1C means and goal of < 7%. Discussed lifestyle modification with eating less foods that are high in CHOs and eliminating any beverages with sugar. Also discussed importance of moving body/sweating every day, as this will help with blood sugar control. Instructed to check blood sugars at least 3-4x/day and take logbook to PCP appt. Pt states he has given insulin in abdomen and arms. Discussed ReliOn insulin at [facility] (Novolin 70/30) and cost is $24.99. Will need syringes.  Will ask RN to allow pt to give his own insulin and stick finger for CBGs.  Inpatient Diabetes Program Recommendations:     70/30 25 units bid Novolog 0-20 units tidwc and hs  Will continue to follow.   Thank you. Ailene Ards, RD, LDN, CDE Inpatient Diabetes Coordinator [phone]

## 2020-03-17 LAB — BASIC METABOLIC PANEL
Anion gap: 22 — ABNORMAL HIGH (ref 5–15)
Anion gap: 17 — ABNORMAL HIGH (ref 5–15)
BUN: 32 mg/dL — ABNORMAL HIGH (ref 6–20)
BUN: 29 mg/dL — ABNORMAL HIGH (ref 6–20)
CO2: 17 mmol/L — ABNORMAL LOW (ref 22–32)
CO2: 20 mmol/L — ABNORMAL LOW (ref 22–32)
Calcium: 9.6 mg/dL (ref 8.9–10.3)
Calcium: 9.5 mg/dL (ref 8.9–10.3)
Chloride: 94 mmol/L — ABNORMAL LOW (ref 98–111)
Chloride: 96 mmol/L — ABNORMAL LOW (ref 98–111)
Creatinine, Ser: 1.82 mg/dL — ABNORMAL HIGH (ref 0.61–1.24)
Creatinine, Ser: 1.75 mg/dL — ABNORMAL HIGH (ref 0.61–1.24)
GFR calc Af Amer: 60 mL/min — ABNORMAL LOW (ref >60)
GFR calc Af Amer: >60 mL/min (ref >60)
GFR calc non Af Amer: 52 mL/min — ABNORMAL LOW (ref >60)
GFR calc non Af Amer: 54 mL/min — ABNORMAL LOW (ref >60)
Glucose, Bld: 320 mg/dL — ABNORMAL HIGH (ref 70–99)
Glucose, Bld: 286 mg/dL — ABNORMAL HIGH (ref 70–99)
Potassium: 4 mmol/L (ref 3.5–5.1)
Potassium: 4.9 mmol/L (ref 3.5–5.1)
Sodium: 133 mmol/L — ABNORMAL LOW (ref 135–145)
Sodium: 133 mmol/L — ABNORMAL LOW (ref 135–145)

## 2020-03-17 LAB — CBC
HCT: 46.8 % (ref 39.0–52.0)
Hemoglobin: 15 g/dL (ref 13.0–17.0)
MCH: 25 pg — ABNORMAL LOW (ref 26.0–34.0)
MCHC: 32.1 g/dL (ref 30.0–36.0)
MCV: 78.1 fL — ABNORMAL LOW (ref 80.0–100.0)
Platelets: 332 10*3/uL (ref 150–400)
RBC: 5.99 MIL/uL — ABNORMAL HIGH (ref 4.22–5.81)
RDW: 14.1 % (ref 11.5–15.5)
WBC: 13.4 10*3/uL — ABNORMAL HIGH (ref 4.0–10.5)
nRBC: 0 % (ref 0.0–0.2)

## 2020-03-17 LAB — GLUCOSE, CAPILLARY
Glucose-Capillary: 322 mg/dL — ABNORMAL HIGH (ref 70–99)
Glucose-Capillary: 317 mg/dL — ABNORMAL HIGH (ref 70–99)
Glucose-Capillary: 305 mg/dL — ABNORMAL HIGH (ref 70–99)
Glucose-Capillary: 241 mg/dL — ABNORMAL HIGH (ref 70–99)
Glucose-Capillary: 200 mg/dL — ABNORMAL HIGH (ref 70–99)

## 2020-03-17 MED ORDER — SODIUM CHLORIDE 0.9 % IV SOLN: INTRAVENOUS | Status: AC

## 2020-03-17 MED ORDER — PANTOPRAZOLE SODIUM 40 MG PO TBEC
40.0000 mg | DELAYED_RELEASE_TABLET | Freq: Two times a day (BID) | ORAL | Status: DC
  Administered 2020-03-17 – 2020-03-19 (×4): 40 mg via ORAL
  Filled 2020-03-17 (×4): qty 1

## 2020-03-17 MED ORDER — SODIUM CHLORIDE 0.9 % IV SOLN: INTRAVENOUS | Status: DC

## 2020-03-17 MED ORDER — INSULIN ASPART PROT & ASPART (70-30 MIX) 100 UNIT/ML ~~LOC~~ SUSP
30.0000 [IU] | Freq: Two times a day (BID) | SUBCUTANEOUS | Status: DC
  Administered 2020-03-17 – 2020-03-18 (×2): 30 [IU] via SUBCUTANEOUS
  Filled 2020-03-17: qty 10

## 2020-03-17 NOTE — Progress Notes (Signed)
Inpatient Diabetes Program Recommendations  AACE/ADA: New Consensus Statement on Inpatient Glycemic Control (2015)  Target Ranges:  Prepandial:   less than 140 mg/dL      Peak postprandial:   less than 180 mg/dL (1-2 hours)      Critically ill patients:  140 - 180 mg/dL   Lab Results  Component Value Date   GLUCAP 322 (H) 03/17/2020   HGBA1C 11.4 (H) 03/15/2020    Review of Glycemic Control   Current orders for Inpatient glycemic control: 70/30 25 units bid, Novolog 0-20 units tidwc and hs  Blood sugar this am - 322 mg/dL Needs titration.  Inpatient Diabetes Program Recommendations:     Increase 70/30 to 30 units bid. Continue titration until blood sugars < 180 mg/dL.  Will see pt today for f/u.  Thank you. Ailene Ards, RD, LDN, CDE Inpatient Diabetes Coordinator [phone]

## 2020-03-17 NOTE — Progress Notes (Signed)
PROGRESS NOTE    Michael [name]    Code Status: Full Code  QQP:[id number] DOB: 07-Nov-1995 DOA: 03/14/2020 LOS: 2 days  PCP: Kerin Perna, NP CC:  Chief Complaint  Patient presents with   Chest Pain   spitting up blood       Hospital Summary   23 y.o.malewithhistory of diabetes mellitus type 2 was admitted this year in January with DKA with new onset diabetes states he has been throwing up and having burning chest pain for the last 4 to 5 days. Has not been taking insulin for >1 month--does not remember the dose of insulin he was supposed to be on. ED Course: Afebrile,hemodynamically stable EKG showing sinus tachycardia blood pressure was in the low normal labs show sodium 124 blood glucose greater than 6 calcium 10.9 anion gap 30 WBC 21.5 extensive troponin was 3 chest x-ray unremarkable Covid test negative. UA shows ketones.  Hospital course: Patient admitted to stepdown unit withIV insulin and [name] fluids for DKA management has since been off insulin drip.  6/9: Anion gap increased once again and glucose 300+.  Made n.p.o. and restarted on [name] fluids with increased insulin    A & P   Principal Problem:   DKA (diabetic ketoacidoses) ([place]) Active Problems:   AKI (acute kidney injury) ([place])   Nausea & vomiting   1. DKA with poorly controlled type 2 diabetes a. Currently off insulin drip however anion gap (16->22) and acidosis (CO2 23-> 17) has worsened b. HA1C 11.4 c. Change to n.p.o. d. [name] fluids e. will give insulin and follow-up BMP this afternoon f. Increase NovoLog 70/30 to 30 units twice daily and continue sliding scale g. Diabetic coordinator on board  2. Nausea/vomiting with hematemesis concerning for Mallory-Weiss tear a. Hb 15.0 and stable b. Still with some odynophagia c. Increase PPI to twice daily  3. AKI secondary to DKA a. Creatinine 1.8, improved from peak 3.38, baseline creatinine 0.8 b. [name] fluids as above  4. Hyponatremia a. [name] fluids and  correction of hyperglycemia  5. Noncardiac chest pain suspect secondary to dyspepsia and esophagitis a. EKG was sinus tachycardia and T wave inversions in lead III and aVF, troponin negative b. Continue Carafate c. Continue twice daily PPI d. Continue telemetry  6. Reactive leukocytosis  7. Morbid obesity  8. Hypertension a. Holding home lisinopril in setting of AKI b. Continue metoprolol tartrate 25 mg twice daily  9. Hyperlipidemia a. Continue statin   DVT prophylaxis: SCDs Family Communication: No family at bedside Disposition Plan:  Status is: Inpatient  Remains inpatient appropriate because:[name] treatments appropriate due to intensity of illness or inability to take PO   Dispo: The patient is from: Home              Anticipated d/c is to: Home              Anticipated d/c date is: 2 days              Patient currently is not medically stable to d/c.          Pressure injury documentation    None  Consultants  None  Procedures  None  Antibiotics   Anti-infectives (From admission, onward)   None        Subjective   Still with some pain while swallowing but otherwise feeling better than when he came in.  No other issues, no other complaints and no overnight events.  Objective   Vitals:  03/17/20 0752 03/17/20 0800 03/17/20 1135 03/17/20 1200  BP: (!) 158/50 137/77 120/77 120/65  Pulse: 97 (!) 113 (!) 104 (!) 102  Resp: _0 Temp:  (!) 97.4 F (36.3 C)  97.8 F (36.6 C)  TempSrc:  Oral  Oral  SpO2: 100% 100% 97% 100%  Weight:      Height:        Intake/Output Summary (Last 24 hours) at 03/17/2020 1322 Last data filed at 03/17/2020 1200 Gross per 24 hour  Intake 1214.9 ml  Output 1500 ml  Net -285.1 ml   Filed Weights   03/14/20 2329 03/15/20 0449  Weight: (!) 190.5 kg (!) 201.4 kg    Examination:  Physical Exam Vitals and nursing note reviewed.  Constitutional:      Appearance: Normal appearance. He is obese.  HENT:       Head: Normocephalic and atraumatic.  Eyes:     Conjunctiva/sclera: Conjunctivae normal.  Cardiovascular:     Rate and Rhythm: Normal rate and regular rhythm.  Pulmonary:     Effort: Pulmonary effort is normal.     Breath sounds: Normal breath sounds.  Abdominal:     General: Abdomen is flat.     Palpations: Abdomen is soft.  Musculoskeletal:        General: No swelling or tenderness.  Skin:    Coloration: Skin is not jaundiced or pale.  Neurological:     Mental Status: He is alert. Mental status is at baseline.  Psychiatric:        Mood and Affect: Mood normal.        Behavior: Behavior normal.     Data Reviewed: I have personally reviewed following labs and imaging studies  CBC: Recent Labs  Lab 03/15/20 0035 03/15/20 0146 03/15/20 0714 03/17/20 0823  WBC 21.5*  --  22.0*   21.3* 13.4*  NEUTROABS 17.8*  --   --   --   HGB 15.2 17.7* 14.8   14.6 15.0  HCT 47.3 52.0 46.0   45.0 46.8  MCV 77.5*  --  79.9*   77.6* 78.1*  PLT 422*  --  343   400 116   Basic Metabolic Panel: Recent Labs  Lab 03/15/20 1653 03/15/20 2041 03/16/20 0713 03/16/20 1421 03/17/20 0823  NA 137 138 136 137 133*  K 4.6 4.6 3.3* 3.5 4.0  CL 101 97* 101 98 94*  CO2 18* 21* 20* 23 17*  GLUCOSE 219* 180* 195* 169* 320*  BUN 64* 69* 49* 48* 32*  CREATININE 3.19* 3.03* 2.11* 2.18* 1.82*  CALCIUM 10.1 10.5* 9.1 9.7 9.6   GFR: Estimated Creatinine Clearance: 117 mL/min (A) (by C-G formula based on SCr of 1.82 mg/dL (H)). Liver Function Tests: Recent Labs  Lab 03/15/20 0035  AST 19  ALT 24  ALKPHOS 91  BILITOT 2.1*  PROT 8.5*  ALBUMIN 4.4   Recent Labs  Lab 03/15/20 0035  LIPASE 23   No results for input(s): AMMONIA in the last 168 hours. Coagulation Profile: No results for input(s): INR, PROTIME in the last 168 hours. Cardiac Enzymes: No results for input(s): CKTOTAL, CKMB, CKMBINDEX, TROPONINI in the last 168 hours. BNP (last 3 results) No results for input(s): PROBNP in  the last 8760 hours. HbA1C: Recent Labs    03/15/20 0714  HGBA1C 11.4*   CBG: Recent Labs  Lab 03/16/20 1734 03/16/20 2119 03/17/20 0657 03/17/20 1023 03/17/20 1155  GLUCAP 253* 270* 322* 317* 305*   Lipid Profile:  No results for input(s): CHOL, HDL, LDLCALC, TRIG, CHOLHDL, LDLDIRECT in the last 72 hours. Thyroid Function Tests: No results for input(s): TSH, T4TOTAL, FREET4, T3FREE, THYROIDAB in the last 72 hours. Anemia Panel: No results for input(s): VITAMINB12, FOLATE, FERRITIN, TIBC, IRON, RETICCTPCT in the last 72 hours. Sepsis Labs: No results for input(s): PROCALCITON, LATICACIDVEN in the last 168 hours.  Recent Results (from the past 240 hour(s))  SARS Coronavirus 2 by RT PCR (hospital order, performed in [facility] hospital lab) Nasopharyngeal Nasopharyngeal Swab     Status: None   Collection Time: 03/15/20  1:45 AM   Specimen: Nasopharyngeal Swab  Result Value Ref Range Status   SARS Coronavirus 2 NEGATIVE NEGATIVE Final    Comment: (NOTE) SARS-CoV-2 target nucleic acids are NOT DETECTED. The SARS-CoV-2 RNA is generally detectable in upper and lower respiratory specimens during the acute phase of infection. The lowest concentration of SARS-CoV-2 viral copies this assay can detect is 250 copies / mL. A negative result does not preclude SARS-CoV-2 infection and should not be used as the sole basis for treatment or other patient management decisions.  A negative result may occur with improper specimen collection / handling, submission of specimen other than nasopharyngeal swab, presence of viral mutation(s) within the areas targeted by this assay, and inadequate number of viral copies (<250 copies / mL). A negative result must be combined with clinical observations, patient history, and epidemiological information. Fact Sheet for Patients:   StrictlyIdeas.no Fact Sheet for Healthcare  Providers: BankingDealers.co.za This test is not yet approved or cleared  by the [place] FDA and has been authorized for detection and/or diagnosis of SARS-CoV-2 by FDA under an Emergency Use Authorization (EUA).  This EUA will remain in effect (meaning this test can be used) for the duration of the COVID-19 declaration under Section 564(b)(1) of the Act, 21 U.S.C. section 360bbb-3(b)(1), unless the authorization is terminated or revoked sooner. Performed at [facility], [address] [place], [place] [address]   MRSA PCR Screening     Status: None   Collection Time: 03/15/20  4:32 AM   Specimen: Nasal Mucosa; Nasopharyngeal  Result Value Ref Range Status   MRSA by PCR NEGATIVE NEGATIVE Final    Comment:        The GeneXpert MRSA Assay (FDA approved for NASAL specimens only), is one component of a comprehensive MRSA colonization surveillance program. It is not intended to diagnose MRSA infection nor to guide or monitor treatment for MRSA infections. Performed at [facility], [place] [address], [place], [place] [address]          Radiology Studies: No results found.      Scheduled Meds:  Chlorhexidine Gluconate Cloth  6 each Topical Daily   insulin aspart  0-20 Units Subcutaneous TID WC   insulin aspart  0-5 Units Subcutaneous QHS   insulin aspart protamine- aspart  30 Units Subcutaneous BID WC   insulin starter kit- pen needles  1 kit Other Once   mouth rinse  15 mL Mouth Rinse BID   metoprolol tartrate  25 mg Oral BID   pantoprazole  40 mg Oral Daily   pravastatin  20 mg Oral q1800   sucralfate  1 g Oral TID WC & HS   Continuous Infusions:  sodium chloride 125 mL/hr at 03/17/20 1200   lactated ringers with KCl 20 mEq/L Stopped (03/17/20 1133)     Time spent: 35 minutes with over 50% of the time coordinating  the patient's care    Harold Hedge, DO [facility] Pager  [phone]  Call night coverage person covering after 7pm

## 2020-03-17 NOTE — Progress Notes (Signed)
Inpatient Diabetes Program Recommendations  AACE/ADA: New Consensus Statement on Inpatient Glycemic Control (2015)  Target Ranges:  Prepandial:   less than 140 mg/dL      Peak postprandial:   less than 180 mg/dL (1-2 hours)      Critically ill patients:  140 - 180 mg/dL   Lab Results  Component Value Date   GLUCAP 241 (H) 03/17/2020   HGBA1C 11.4 (H) 03/15/2020    Review of Glycemic Control  CBGs today - 322, 317, 305, 241. 37 units of Novolog thus far today. 70/30 increased to 30 units bid  CO2 improved from 17 to 20. AG improved from 22 to 17. Hopefully avoid going back on insulin drip.  Spoke with pt and Mother at bedside about his diabetes control. Explained that he was very resistant to insulin and we were titrating his insulin doses. Requested pt stick finger for blood sugars and give his own insulin while inpatient.   Inpatient Diabetes Program Recommendations:     Change Novolog 0-20 units to Q4H while NPO. Increase 70/30 in am if FBS > 180 mg/dL (40 units bid) Will continue to follow glucose trends.   Thank you. Ailene Ards, RD, LDN, CDE Inpatient Diabetes Coordinator [phone]

## 2020-03-18 LAB — GLUCOSE, CAPILLARY
Glucose-Capillary: 258 mg/dL — ABNORMAL HIGH (ref 70–99)
Glucose-Capillary: 245 mg/dL — ABNORMAL HIGH (ref 70–99)
Glucose-Capillary: 173 mg/dL — ABNORMAL HIGH (ref 70–99)
Glucose-Capillary: 192 mg/dL — ABNORMAL HIGH (ref 70–99)

## 2020-03-18 LAB — BASIC METABOLIC PANEL
Anion gap: 16 — ABNORMAL HIGH (ref 5–15)
Anion gap: 17 — ABNORMAL HIGH (ref 5–15)
BUN: 19 mg/dL (ref 6–20)
BUN: 16 mg/dL (ref 6–20)
CO2: 23 mmol/L (ref 22–32)
CO2: 18 mmol/L — ABNORMAL LOW (ref 22–32)
Calcium: 9.3 mg/dL (ref 8.9–10.3)
Calcium: 8.8 mg/dL — ABNORMAL LOW (ref 8.9–10.3)
Chloride: 98 mmol/L (ref 98–111)
Chloride: 98 mmol/L (ref 98–111)
Creatinine, Ser: 1.39 mg/dL — ABNORMAL HIGH (ref 0.61–1.24)
Creatinine, Ser: 1.29 mg/dL — ABNORMAL HIGH (ref 0.61–1.24)
GFR calc Af Amer: >60 mL/min (ref >60)
GFR calc Af Amer: >60 mL/min (ref >60)
GFR calc non Af Amer: >60 mL/min (ref >60)
GFR calc non Af Amer: >60 mL/min (ref >60)
Glucose, Bld: 216 mg/dL — ABNORMAL HIGH (ref 70–99)
Glucose, Bld: 259 mg/dL — ABNORMAL HIGH (ref 70–99)
Potassium: 3.4 mmol/L — ABNORMAL LOW (ref 3.5–5.1)
Potassium: 3.4 mmol/L — ABNORMAL LOW (ref 3.5–5.1)
Sodium: 137 mmol/L (ref 135–145)
Sodium: 133 mmol/L — ABNORMAL LOW (ref 135–145)

## 2020-03-18 LAB — CBC
HCT: 45.2 % (ref 39.0–52.0)
Hemoglobin: 14.6 g/dL (ref 13.0–17.0)
MCH: 24.9 pg — ABNORMAL LOW (ref 26.0–34.0)
MCHC: 32.3 g/dL (ref 30.0–36.0)
MCV: 77 fL — ABNORMAL LOW (ref 80.0–100.0)
Platelets: 218 10*3/uL (ref 150–400)
RBC: 5.87 MIL/uL — ABNORMAL HIGH (ref 4.22–5.81)
RDW: 13.8 % (ref 11.5–15.5)
WBC: 12.9 10*3/uL — ABNORMAL HIGH (ref 4.0–10.5)
nRBC: 0 % (ref 0.0–0.2)

## 2020-03-18 LAB — MAGNESIUM: Magnesium: 1.9 mg/dL (ref 1.7–2.4)

## 2020-03-18 MED ORDER — POTASSIUM CHLORIDE CRYS ER 20 MEQ PO TBCR
20.0000 meq | EXTENDED_RELEASE_TABLET | Freq: Once | ORAL | Status: AC
  Administered 2020-03-18: 20 meq via ORAL
  Filled 2020-03-18: qty 1

## 2020-03-18 MED ORDER — LACTATED RINGERS IV SOLN: INTRAVENOUS | Status: AC

## 2020-03-18 MED ORDER — INSULIN ASPART PROT & ASPART (70-30 MIX) 100 UNIT/ML ~~LOC~~ SUSP
35.0000 [IU] | Freq: Two times a day (BID) | SUBCUTANEOUS | Status: DC
  Administered 2020-03-18 – 2020-03-19 (×2): 35 [IU] via SUBCUTANEOUS
  Filled 2020-03-18: qty 10

## 2020-03-18 NOTE — Progress Notes (Signed)
PROGRESS NOTE    Michael [name]    Code Status: Full Code  PFX:[id number] DOB: 08-Jun-1996 DOA: 03/14/2020 LOS: 3 days  PCP: Kerin Perna, NP CC:  Chief Complaint  Patient presents with  . Chest Pain  . spitting up blood       Hospital Summary   23 y.o.malewithhistory of diabetes mellitus type 2 was admitted this year in January with DKA with new onset diabetes states he has been throwing up and having burning chest pain for the last 4 to 5 days. Has not been taking insulin for >1 month--does not remember the dose of insulin he was supposed to be on. ED Course: Afebrile,hemodynamically stable EKG showing sinus tachycardia blood pressure was in the low normal labs show sodium 124 blood glucose greater than 6 calcium 10.9 anion gap 30 WBC 21.5 extensive troponin was 3 chest x-ray unremarkable Covid test negative. UA shows ketones.  Hospital course: Patient admitted to stepdown unit withIV insulin and [name] fluids for DKA management has since been off insulin drip.  6/9: Anion gap increased once again and glucose 300+.  Made n.p.o. and restarted on [name] fluids with increased insulin    A & P   Principal Problem:   DKA (diabetic ketoacidoses) ([place]) Active Problems:   AKI (acute kidney injury) ([place])   Nausea & vomiting   1. DKA with poorly controlled type 2 diabetes a. Currently off insulin drip however anion gap and acidosis fluctuant. b. HA1C 11.4 c. Full liquid carb controlled diet  d. [name] lactated Ringer's e. Increase NovoLog 70/30 to 35 units twice daily and continue sliding scale f. Diabetic coordinator on board  2. Nausea/vomiting with hematemesis concerning for Mallory-Weiss tear a. Hb stable b. Still with some odynophagia  c. PPI twice daily d. Slowly advance diet  3. AKI secondary to DKA a. Creatinine 1.8->1.3, improved from peak 3.38, baseline creatinine 0.8 b. [name] fluids as above  4. Hyponatremia  a. [name] fluids and correction of  hyperglycemia  5. Noncardiac chest pain suspect secondary to dyspepsia and esophagitis a. EKG was sinus tachycardia and T wave inversions in lead III and aVF, troponin negative b. Continue Carafate c. Continue twice daily PPI d. Continue telemetry  6. Reactive leukocytosis  7. Morbid obesity  8. Hypertension a. Holding home lisinopril in setting of AKI b. Continue metoprolol tartrate 25 mg twice daily  9. Hyperlipidemia a. Continue statin   DVT prophylaxis: SCDs Family Communication: No family at bedside Disposition Plan: Waiting for anion gap to resolve before discharge Status is: Inpatient transfer to [place] inpatient appropriate because:[name] treatments appropriate due to intensity of illness or inability to take PO   Dispo: The patient is from: Home              Anticipated d/c is to: Home              Anticipated d/c date is: 2 days              Patient currently is not medically stable to d/c.          Pressure injury documentation    None  Consultants  None  Procedures  None  Antibiotics   Anti-infectives (From admission, onward)   None        Subjective   Still with some discomfort while swallowing but he can advance diet slowly.  Had multiple questions regarding his diet once he is discharged.  Otherwise no other complaints.  Objective  Vitals:   03/18/20 0900 03/18/20 1000 03/18/20 1100 03/18/20 1200  BP:    (!) 134/55  Pulse: (!) 111 (!) 134 (!) 105 94  Resp: (!) 22 (!) 30 20   Temp:    97.8 F (36.6 C)  TempSrc:    Oral  SpO2: 97% (!) 89% 97% 98%  Weight:      Height:        Intake/Output Summary (Last 24 hours) at 03/18/2020 1416 Last data filed at 03/18/2020 1229 Gross per 24 hour  Intake 979.09 ml  Output 2250 ml  Net -1270.91 ml   Filed Weights   03/14/20 2329 03/15/20 0449  Weight: (!) 190.5 kg (!) 201.4 kg    Examination:  Physical Exam Vitals and nursing note reviewed.  Constitutional:       Appearance: Normal appearance.  HENT:     Head: Normocephalic and atraumatic.     Mouth/Throat:     Mouth: Mucous membranes are moist.     Pharynx: No oropharyngeal exudate.     Comments: Mild oral pharyngeal erythema Eyes:     Conjunctiva/sclera: Conjunctivae normal.  Cardiovascular:     Rate and Rhythm: Regular rhythm. Tachycardia present.  Pulmonary:     Effort: Pulmonary effort is normal.     Breath sounds: Normal breath sounds.  Abdominal:     General: Abdomen is flat.     Palpations: Abdomen is soft.  Musculoskeletal:        General: No swelling or tenderness.  Skin:    Coloration: Skin is not jaundiced or pale.  Neurological:     Mental Status: He is alert. Mental status is at baseline.  Psychiatric:        Mood and Affect: Mood normal.        Behavior: Behavior normal.     Data Reviewed: I have personally reviewed following labs and imaging studies  CBC: Recent Labs  Lab 03/15/20 0035 03/15/20 0146 03/15/20 0714 03/17/20 0823 03/18/20 0247  WBC 21.5*  --  22.0*  21.3* 13.4* 12.9*  NEUTROABS 17.8*  --   --   --   --   HGB 15.2 17.7* 14.8  14.6 15.0 14.6  HCT 47.3 52.0 46.0  45.0 46.8 45.2  MCV 77.5*  --  79.9*  77.6* 78.1* 77.0*  PLT 422*  --  343  400 332 423   Basic Metabolic Panel: Recent Labs  Lab 03/16/20 1421 03/17/20 0823 03/17/20 1243 03/18/20 0247 03/18/20 1129  NA 137 133* 133* 137 133*  K 3.5 4.0 4.9 3.4* 3.4*  CL 98 94* 96* 98 98  CO2 23 17* 20* 23 18*  GLUCOSE 169* 320* 286* 216* 259*  BUN 48* 32* 29* 19 16  CREATININE 2.18* 1.82* 1.75* 1.39* 1.29*  CALCIUM 9.7 9.6 9.5 9.3 8.8*  MG  --   --   --  1.9  --    GFR: Estimated Creatinine Clearance: 165 mL/min (A) (by C-G formula based on SCr of 1.29 mg/dL (H)). Liver Function Tests: Recent Labs  Lab 03/15/20 0035  AST 19  ALT 24  ALKPHOS 91  BILITOT 2.1*  PROT 8.5*  ALBUMIN 4.4   Recent Labs  Lab 03/15/20 0035  LIPASE 23   No results for input(s): AMMONIA in the  last 168 hours. Coagulation Profile: No results for input(s): INR, PROTIME in the last 168 hours. Cardiac Enzymes: No results for input(s): CKTOTAL, CKMB, CKMBINDEX, TROPONINI in the last 168 hours. BNP (last 3 results) No results  for input(s): PROBNP in the last 8760 hours. HbA1C: No results for input(s): HGBA1C in the last 72 hours. CBG: Recent Labs  Lab 03/17/20 1155 03/17/20 1640 03/17/20 2134 03/18/20 0812 03/18/20 1224  GLUCAP 305* 241* 200* 258* 245*   Lipid Profile: No results for input(s): CHOL, HDL, LDLCALC, TRIG, CHOLHDL, LDLDIRECT in the last 72 hours. Thyroid Function Tests: No results for input(s): TSH, T4TOTAL, FREET4, T3FREE, THYROIDAB in the last 72 hours. Anemia Panel: No results for input(s): VITAMINB12, FOLATE, FERRITIN, TIBC, IRON, RETICCTPCT in the last 72 hours. Sepsis Labs: No results for input(s): PROCALCITON, LATICACIDVEN in the last 168 hours.  Recent Results (from the past 240 hour(s))  SARS Coronavirus 2 by RT PCR (hospital order, performed in [facility] hospital lab) Nasopharyngeal Nasopharyngeal Swab     Status: None   Collection Time: 03/15/20  1:45 AM   Specimen: Nasopharyngeal Swab  Result Value Ref Range Status   SARS Coronavirus 2 NEGATIVE NEGATIVE Final    Comment: (NOTE) SARS-CoV-2 target nucleic acids are NOT DETECTED. The SARS-CoV-2 RNA is generally detectable in upper and lower respiratory specimens during the acute phase of infection. The lowest concentration of SARS-CoV-2 viral copies this assay can detect is 250 copies / mL. A negative result does not preclude SARS-CoV-2 infection and should not be used as the sole basis for treatment or other patient management decisions.  A negative result may occur with improper specimen collection / handling, submission of specimen other than nasopharyngeal swab, presence of viral mutation(s) within the areas targeted by this assay, and inadequate number of viral copies (<250 copies / mL).  A negative result must be combined with clinical observations, patient history, and epidemiological information. Fact Sheet for Patients:   StrictlyIdeas.no Fact Sheet for Healthcare Providers: BankingDealers.co.za This test is not yet approved or cleared  by the [place] FDA and has been authorized for detection and/or diagnosis of SARS-CoV-2 by FDA under an Emergency Use Authorization (EUA).  This EUA will remain in effect (meaning this test can be used) for the duration of the COVID-19 declaration under Section 564(b)(1) of the Act, 21 U.S.C. section 360bbb-3(b)(1), unless the authorization is terminated or revoked sooner. Performed at [facility], [address] [place], [place] [address]   MRSA PCR Screening     Status: None   Collection Time: 03/15/20  4:32 AM   Specimen: Nasal Mucosa; Nasopharyngeal  Result Value Ref Range Status   MRSA by PCR NEGATIVE NEGATIVE Final    Comment:        The GeneXpert MRSA Assay (FDA approved for NASAL specimens only), is one component of a comprehensive MRSA colonization surveillance program. It is not intended to diagnose MRSA infection nor to guide or monitor treatment for MRSA infections. Performed at [facility], [place] [address] [place], [place] [address]          Radiology Studies: No results found.      Scheduled Meds: . Chlorhexidine Gluconate Cloth  6 each Topical Daily  . insulin aspart  0-20 Units Subcutaneous TID WC  . insulin aspart  0-5 Units Subcutaneous QHS  . insulin aspart protamine- aspart  35 Units Subcutaneous BID WC  . insulin starter kit- pen needles  1 kit Other Once  . mouth rinse  15 mL Mouth Rinse BID  . metoprolol tartrate  25 mg Oral BID  . pantoprazole  40 mg Oral BID  . pravastatin  20 mg Oral q1800  . sucralfate  1 g Oral TID WC & HS   Continuous Infusions: . lactated ringers       Time spent: 33  minutes with over 50% of the time coordinating the patient's care    Harold Hedge, DO [facility] Pager [phone]  Call night coverage person covering after 7pm

## 2020-03-19 LAB — BASIC METABOLIC PANEL
Anion gap: 16 — ABNORMAL HIGH (ref 5–15)
Anion gap: 13 (ref 5–15)
BUN: 9 mg/dL (ref 6–20)
BUN: 7 mg/dL (ref 6–20)
CO2: 22 mmol/L (ref 22–32)
CO2: 23 mmol/L (ref 22–32)
Calcium: 9.3 mg/dL (ref 8.9–10.3)
Calcium: 8.7 mg/dL — ABNORMAL LOW (ref 8.9–10.3)
Chloride: 94 mmol/L — ABNORMAL LOW (ref 98–111)
Chloride: 100 mmol/L (ref 98–111)
Creatinine, Ser: 0.94 mg/dL (ref 0.61–1.24)
Creatinine, Ser: 0.9 mg/dL (ref 0.61–1.24)
GFR calc Af Amer: >60 mL/min (ref >60)
GFR calc Af Amer: >60 mL/min (ref >60)
GFR calc non Af Amer: >60 mL/min (ref >60)
GFR calc non Af Amer: >60 mL/min (ref >60)
Glucose, Bld: 188 mg/dL — ABNORMAL HIGH (ref 70–99)
Glucose, Bld: 137 mg/dL — ABNORMAL HIGH (ref 70–99)
Potassium: 3.2 mmol/L — ABNORMAL LOW (ref 3.5–5.1)
Potassium: 3.2 mmol/L — ABNORMAL LOW (ref 3.5–5.1)
Sodium: 132 mmol/L — ABNORMAL LOW (ref 135–145)
Sodium: 136 mmol/L (ref 135–145)

## 2020-03-19 LAB — CBC
HCT: 40.7 % (ref 39.0–52.0)
Hemoglobin: 13.4 g/dL (ref 13.0–17.0)
MCH: 25.4 pg — ABNORMAL LOW (ref 26.0–34.0)
MCHC: 32.9 g/dL (ref 30.0–36.0)
MCV: 77.2 fL — ABNORMAL LOW (ref 80.0–100.0)
Platelets: 306 10*3/uL (ref 150–400)
RBC: 5.27 MIL/uL (ref 4.22–5.81)
RDW: 13.8 % (ref 11.5–15.5)
WBC: 12.4 10*3/uL — ABNORMAL HIGH (ref 4.0–10.5)
nRBC: 0 % (ref 0.0–0.2)

## 2020-03-19 LAB — GLUCOSE, CAPILLARY
Glucose-Capillary: 184 mg/dL — ABNORMAL HIGH (ref 70–99)
Glucose-Capillary: 155 mg/dL — ABNORMAL HIGH (ref 70–99)

## 2020-03-19 LAB — MAGNESIUM: Magnesium: 1.8 mg/dL (ref 1.7–2.4)

## 2020-03-19 MED ORDER — "INSULIN SYRINGE 31G X 5/16"" 0.5 ML MISC": 1.0000 [IU] | Freq: Two times a day (BID) | 1 refills | Status: AC

## 2020-03-19 MED ORDER — METOPROLOL TARTRATE 25 MG PO TABS: 25.0000 mg | ORAL_TABLET | Freq: Two times a day (BID) | ORAL | 2 refills | Status: DC

## 2020-03-19 MED ORDER — PRAVASTATIN SODIUM 20 MG PO TABS
20.0000 mg | ORAL_TABLET | Freq: Every day | ORAL | 1 refills | Status: DC
Start: 2020-03-19 — End: 2020-08-09

## 2020-03-19 MED ORDER — "INSULIN SYRINGE 31G X 5/16"" 0.5 ML MISC": 1.0000 [IU] | Freq: Two times a day (BID) | 1 refills | Status: DC

## 2020-03-19 MED ORDER — PANTOPRAZOLE SODIUM 40 MG PO TBEC: 40.0000 mg | DELAYED_RELEASE_TABLET | Freq: Two times a day (BID) | ORAL | 1 refills | Status: DC

## 2020-03-19 MED ORDER — SUCRALFATE 1 G PO TABS: 1.0000 g | ORAL_TABLET | Freq: Three times a day (TID) | ORAL | 0 refills | Status: DC

## 2020-03-19 MED ORDER — NOVOLIN 70/30 FLEXPEN RELION (70-30) 100 UNIT/ML ~~LOC~~ SUPN: 35.0000 [IU] | PEN_INJECTOR | Freq: Two times a day (BID) | SUBCUTANEOUS | 1 refills | Status: DC

## 2020-03-19 MED ORDER — INSULIN ASPART PROT & ASPART (70-30 MIX) 100 UNIT/ML ~~LOC~~ SUSP
40.0000 [IU] | Freq: Two times a day (BID) | SUBCUTANEOUS | Status: DC
  Filled 2020-03-19: qty 10

## 2020-03-19 MED ORDER — POTASSIUM CHLORIDE CRYS ER 20 MEQ PO TBCR
40.0000 meq | EXTENDED_RELEASE_TABLET | Freq: Once | ORAL | Status: AC
  Administered 2020-03-19: 40 meq via ORAL
  Filled 2020-03-19: qty 2

## 2020-03-19 MED ORDER — SODIUM CHLORIDE 0.9 % IV BOLUS
1000.0000 mL | Freq: Once | INTRAVENOUS | Status: AC
  Administered 2020-03-19: 1000 mL via INTRAVENOUS

## 2020-03-19 MED ORDER — PRAVASTATIN SODIUM 20 MG PO TABS
20.0000 mg | ORAL_TABLET | Freq: Every day | ORAL | 1 refills | Status: DC
Start: 2020-03-19 — End: 2020-03-19

## 2020-03-19 MED ORDER — MAGNESIUM SULFATE 2 GM/50ML IV SOLN
2.0000 g | Freq: Once | INTRAVENOUS | Status: AC
  Administered 2020-03-19: 2 g via INTRAVENOUS
  Filled 2020-03-19: qty 50

## 2020-03-19 NOTE — Discharge Instructions (Signed)
Food Choices for Gastroesophageal Reflux Disease, Adult When you have gastroesophageal reflux disease (GERD), the foods you eat and your eating habits are very important. Choosing the right foods can help ease your discomfort. Think about working with a nutrition specialist (dietitian) to help you make good choices. What are tips for following this plan?  Meals  Choose healthy foods that are low in fat, such as fruits, vegetables, whole grains, low-fat dairy products, and lean meat, fish, and poultry.  Eat small meals often instead of 3 large meals a day. Eat your meals slowly, and in a place where you are relaxed. Avoid bending over or lying down until 2-3 hours after eating.  Avoid eating meals 2-3 hours before bed.  Avoid drinking a lot of liquid with meals.  Cook foods using methods other than frying. Bake, grill, or broil food instead.  Avoid or limit: ? Chocolate. ? Peppermint or spearmint. ? Alcohol. ? Pepper. ? Black and decaffeinated coffee. ? Black and decaffeinated tea. ? Bubbly (carbonated) soft drinks. ? Caffeinated energy drinks and soft drinks.  Limit high-fat foods such as: ? Fatty meat or fried foods. ? Whole milk, cream, butter, or ice cream. ? Nuts and nut butters. ? Pastries, donuts, and sweets made with butter or shortening.  Avoid foods that cause symptoms. These foods may be different for everyone. Common foods that cause symptoms include: ? Tomatoes. ? Oranges, lemons, and limes. ? Peppers. ? Spicy food. ? Onions and garlic. ? Vinegar. Lifestyle  Maintain a healthy weight. Ask your doctor what weight is healthy for you. If you need to lose weight, work with your doctor to do so safely.  Exercise for at least 30 minutes for 5 or more days each week, or as told by your doctor.  Wear loose-fitting clothes.  Do not smoke. If you need help quitting, ask your doctor.  Sleep with the head of your bed higher than your feet. Use a wedge under the  mattress or blocks under the bed frame to raise the head of the bed. Summary  When you have gastroesophageal reflux disease (GERD), food and lifestyle choices are very important in easing your symptoms.  Eat small meals often instead of 3 large meals a day. Eat your meals slowly, and in a place where you are relaxed.  Limit high-fat foods such as fatty meat or fried foods.  Avoid bending over or lying down until 2-3 hours after eating.  Avoid peppermint and spearmint, caffeine, alcohol, and chocolate. This information is not intended to replace advice given to you by your health care provider. Make sure you discuss any questions you have with your health care provider. Document Revised: 01/16/2019 Document Reviewed: 10/31/2016 Elsevier Patient Education  2020 [employer]   Gastroesophageal Reflux Disease, Adult Gastroesophageal reflux (GER) happens when acid from the stomach flows up into the tube that connects the mouth and the stomach (esophagus). Normally, food travels down the esophagus and stays in the stomach to be digested. However, when a person has GER, food and stomach acid sometimes move back up into the esophagus. If this becomes a more serious problem, the person may be diagnosed with a disease called gastroesophageal reflux disease (GERD). GERD occurs when the reflux:  Happens often.  Causes frequent or severe symptoms.  Causes problems such as damage to the esophagus. When stomach acid comes in contact with the esophagus, the acid may cause soreness (inflammation) in the esophagus. Over time, GERD may create small holes (ulcers) in the  lining of the esophagus. What are the causes? This condition is caused by a problem with the muscle between the esophagus and the stomach (lower esophageal sphincter, or LES). Normally, the LES muscle closes after food passes through the esophagus to the stomach. When the LES is weakened or abnormal, it does not close properly, and that  allows food and stomach acid to go back up into the esophagus. The LES can be weakened by certain dietary substances, medicines, and medical conditions, including:  Tobacco use.  Pregnancy.  Having a hiatal hernia.  Alcohol use.  Certain foods and beverages, such as coffee, chocolate, onions, and peppermint. What increases the risk? You are more likely to develop this condition if you:  Have an increased body weight.  Have a connective tissue disorder.  Use NSAID medicines. What are the signs or symptoms? Symptoms of this condition include:  Heartburn.  Difficult or painful swallowing.  The feeling of having a lump in the throat.  Abitter taste in the mouth.  Bad breath.  Having a large amount of saliva.  Having an upset or bloated stomach.  Belching.  Chest pain. Different conditions can cause chest pain. Make sure you see your health care provider if you experience chest pain.  Shortness of breath or wheezing.  Ongoing (chronic) cough or a night-time cough.  Wearing away of tooth enamel.  Weight loss. How is this diagnosed? Your health care provider will take a medical history and perform a physical exam. To determine if you have mild or severe GERD, your health care provider may also monitor how you respond to treatment. You may also have tests, including:  A test to examine your stomach and esophagus with a small camera (endoscopy).  A test thatmeasures the acidity level in your esophagus.  A test thatmeasures how much pressure is on your esophagus.  A barium swallow or modified barium swallow test to show the shape, size, and functioning of your esophagus. How is this treated? The goal of treatment is to help relieve your symptoms and to prevent complications. Treatment for this condition may vary depending on how severe your symptoms are. Your health care provider may recommend:  Changes to your diet.  Medicine.  Surgery. Follow these  instructions at home: Eating and drinking   Follow a diet as recommended by your health care provider. This may involve avoiding foods and drinks such as: ? Coffee and tea (with or without caffeine). ? Drinks that containalcohol. ? Energy drinks and sports drinks. ? Carbonated drinks or sodas. ? Chocolate and cocoa. ? Peppermint and mint flavorings. ? Garlic and onions. ? Horseradish. ? Spicy and acidic foods, including peppers, chili powder, curry powder, vinegar, hot sauces, and barbecue sauce. ? Citrus fruit juices and citrus fruits, such as oranges, lemons, and limes. ? Tomato-based foods, such as red sauce, chili, salsa, and pizza with red sauce. ? Fried and fatty foods, such as donuts, french fries, potato chips, and high-fat dressings. ? High-fat meats, such as hot dogs and fatty cuts of red and white meats, such as rib eye steak, sausage, ham, and bacon. ? High-fat dairy items, such as whole milk, butter, and cream cheese.  Eat small, frequent meals instead of large meals.  Avoid drinking large amounts of liquid with your meals.  Avoid eating meals during the 2-3 hours before bedtime.  Avoid lying down right after you eat.  Do not exercise right after you eat. Lifestyle   Do not use any products that contain  nicotine or tobacco, such as cigarettes, e-cigarettes, and chewing tobacco. If you need help quitting, ask your health care provider.  Try to reduce your stress by using methods such as yoga or meditation. If you need help reducing stress, ask your health care provider.  If you are overweight, reduce your weight to an amount that is healthy for you. Ask your health care provider for guidance about a safe weight loss goal. General instructions  Pay attention to any changes in your symptoms.  Take over-the-counter and prescription medicines only as told by your health care provider. Do not take aspirin, ibuprofen, or other NSAIDs unless your health care provider  told you to do so.  Wear loose-fitting clothing. Do not wear anything tight around your waist that causes pressure on your abdomen.  Raise (elevate) the head of your bed about 6 inches (15 cm).  Avoid bending over if this makes your symptoms worse.  Keep all follow-up visits as told by your health care provider. This is important. Contact a health care provider if:  You have: ? New symptoms. ? Unexplained weight loss. ? Difficulty swallowing or it hurts to swallow. ? Wheezing or a persistent cough. ? A hoarse voice.  Your symptoms do not improve with treatment. Get help right away if you:  Have pain in your arms, neck, jaw, teeth, or back.  Feel sweaty, dizzy, or light-headed.  Have chest pain or shortness of breath.  Vomit and your vomit looks like blood or coffee grounds.  Faint.  Have stool that is bloody or black.  Cannot swallow, drink, or eat. Summary  Gastroesophageal reflux happens when acid from the stomach flows up into the esophagus. GERD is a disease in which the reflux happens often, causes frequent or severe symptoms, or causes problems such as damage to the esophagus.  Treatment for this condition may vary depending on how severe your symptoms are. Your health care provider may recommend diet and lifestyle changes, medicine, or surgery.  Contact a health care provider if you have new or worsening symptoms.  Take over-the-counter and prescription medicines only as told by your health care provider. Do not take aspirin, ibuprofen, or other NSAIDs unless your health care provider told you to do so.  Keep all follow-up visits as told by your health care provider. This is important. This information is not intended to replace advice given to you by your health care provider. Make sure you discuss any questions you have with your health care provider. Document Revised: 04/03/2018 Document Reviewed: 04/03/2018 Elsevier Patient Education  2020 [employer].

## 2020-03-19 NOTE — Progress Notes (Signed)
Patient is being discharged home - d/c instructions have been given. Patient will be taken down by wheelchair and picked up by his father.

## 2020-03-19 NOTE — Progress Notes (Signed)
NUTRITION NOTE  RD consulted for nutrition education regarding diabetes.   Lab Results  Component Value Date   HGBA1C 11.4 (H) 03/15/2020    RD provided "Carbohydrate Counting for People with Diabetes" handout from the Academy of Nutrition and Dietetics. Discussed different food groups and their effects on blood sugar, emphasizing carbohydrate-containing foods. Provided list of carbohydrates and recommended serving sizes of common foods.  Discussed importance of controlled and consistent carbohydrate intake throughout the day. Provided examples of ways to balance meals/snacks and encouraged intake of high-fiber, whole grain complex carbohydrates. Teach back method used.  Patient received education while at [facility] at time of DM dx in 10/2019. Patient states that he is knowledgeable about foods to be mindful of and that he reads labels each time they are available to him. Patient is very appreciative of RD visit and for information; states that things have made more sense to him and that he has learned more during this admission than during admission in January. All questions answered; patient aware of ability to ask RN to have RD return for additional discussion prior to d/c.  Expect good compliance.  Body mass index is 57.01 kg/m. Pt meets criteria for morbid obesity based on current BMI.  Current diet order is FLD. Patient has been experiencing reflux, severe in nature, and occurs even with water intake. He has not experienced anything like this in the past. Encouraged patient to further discuss this with MD.   Labs reviewed; HgbA1c: 11.4%, CBG: 184 mg/dl, K: 3.2 mmol/l. Medications reviewed; sliding scale novolog, 40 units novolog 70/30 BID, 2 g IV Mg sulfate x1 run 6/11, 40 mEq Klor-Con x1 dose 6/11.  No further nutrition interventions warranted at this time. RD contact information provided. If additional nutrition issues arise, please re-consult RD.    Trenton Gammon, MS, RD,  LDN, CNSC Inpatient Clinical Dietitian RD pager # available in [facility]  After hours/weekend pager # available in [facility]

## 2020-03-19 NOTE — Progress Notes (Signed)
Inpatient Diabetes Program Recommendations  AACE/ADA: New Consensus Statement on Inpatient Glycemic Control (2015)  Target Ranges:  Prepandial:   less than 140 mg/dL      Peak postprandial:   less than 180 mg/dL (1-2 hours)      Critically ill patients:  140 - 180 mg/dL   Lab Results  Component Value Date   GLUCAP 155 (H) 03/19/2020   HGBA1C 11.4 (H) 03/15/2020    Review of Glycemic Control  Checked in on pt prior to discharge. Feels good about giving his insulin and checking his blood sugars 3-4x/day. Long discussion about healthy diet using portion control, eliminating simple CHOs and moving his body as much as possible. Discussed importance of weight loss for better glucose control.   Discussed above with MD and RN.  Thank you. Ailene Ards, RD, LDN, CDE Inpatient Diabetes Coordinator [phone]

## 2020-03-19 NOTE — Discharge Summary (Signed)
Physician Discharge Summary  Locke Barrell JJH:[id number] DOB: 1996-04-11   PCP: Kerin Perna, NP  Admit date: 03/14/2020 Discharge date: 03/19/2020 Length of Stay: 4 days   Code Status: Full Code  Admitted From:  Home Discharged to:   [place]:  None  Equipment/Devices:  None Discharge Condition:  Stable  Recommendations for Outpatient Follow-up   1. Follow up with PCP in 1 week 2. Follow up BMP/CBC  3. Needs close follow-up for diabetes control 4. Follow-up with suspected Mallory-Weiss tear of esophagus.  Advance diet as tolerated.  Currently on antiacids and full liquid diet at discharge  Hospital Summary   24 y.o.malewithhistory of diabetes mellitus type 2 was admitted this year in January with DKA with new onset diabetes states he has been throwing up and havingburningchest pain for the last 4 to 5 days.Has not been taking insulin for >1 month--does not remember the dose of insulin he was supposed to be on. ED Course: Afebrile,hemodynamically stable EKG showing sinus tachycardia blood pressure was in the low normal labs show sodium 124 blood glucose greater than 6 calcium 10.9 anion gap 30 WBC 21.5 extensive troponin was 3 chest x-ray unremarkable Covid test negative. UA shows ketones. Hospital course: Patient admitted to stepdown unitwithIV insulin andIVfluids for DKA management has since been off insulin drip.  6/9: Anion gap increased once again and glucose 300+.  Made n.p.o. and restarted on IV fluids with increased insulin  6/11: Tolerating full liquid diet but still with some esophageal irritation.  Given IV fluids for mild hypokalemia and hyponatremia.  Seen by the diabetic coordinator and discharged with ReliOn 35 units twice daily and close outpatient follow-up recommended.  Also had extensive education with the dietitian and diabetic coordinator.  He will likely be compliant with conditions going forward.  In regards to his suspected  Mallory-Weiss tear, discharged with Protonix twice daily and Carafate 4 times daily and full liquid diet for at least the next week to 2 and advance as tolerated after this time.  Was not seen by GI however this was discussed with the GI PA and did not recommend any outpatient GI follow-up.  A & P   Principal Problem:   DKA (diabetic ketoacidoses) (HCC) Active Problems:   AKI (acute kidney injury) ([place])   Nausea & vomiting    1. DKA with poorly controlled type 2 diabetes a. Off insulin drip b. HA1C 11.4 c. Full liquid carb controlled diet  d. Discharged with ReliOn 70/30 to 35 units twice daily and outpatient follow-up e. Received extensive education via dietitian and diabetic coordinator.  He is expected to be be compliant with his regimen going forward f. Follow-up labs outpatient  2. Nausea/vomiting with hematemesis concerning for Mallory-Weiss tear a. Hb stable b. Still with some odynophagia  c. PPI twice daily x 1 month, Carafate 4 times daily x1 month d. Full liquid diet at discharge times at least 1 week and slowly advance diet  3. AKI secondary to DKA a. Solved with IV fluids  4. Mild hyponatremia  a. Received IV fluids prior to discharge  5. Noncardiac chest pain secondary to dyspepsia and esophagitis a. EKG was sinus tachycardia and T wave inversions in lead III and aVF, troponin negative b. Continue Carafate c. Continue twice daily PPI  6. Reactive leukocytosis  7. Morbid obesity Body mass index is 57.01 kg/m.  8. Hypertension a. Resume home medications at discharge  9. Hyperlipidemia a. Continue statin    Consultants  .  None  Procedures  . None  Antibiotics   Anti-infectives (From admission, onward)   None       Subjective  Patient seen and examined at bedside no acute distress and resting comfortably.  No events overnight.  Tolerating diet but still with some odynophagia.  A bit overwhelmed with his health overall but was  reassured at bedside.    Denies any chest pain, shortness of breath, fever, nausea, vomiting, urinary or bowel complaints. Otherwise ROS negative    Objective   Discharge Exam: Vitals:   03/19/20 0410 03/19/20 1343  BP: (!) 146/82 129/64  Pulse: 92 93  Resp: 18 18  Temp: 99 F (37.2 C) 98 F (36.7 C)  SpO2: 100% 98%   Vitals:   03/18/20 2044 03/19/20 0023 03/19/20 0410 03/19/20 1343  BP: 140/85 128/80 (!) 146/82 129/64  Pulse: 94 96 92 93  Resp: 18 18 18 18   Temp: 98 F (36.7 C) 97.8 F (36.6 C) 99 F (37.2 C) 98 F (36.7 C)  TempSrc: Oral Oral Oral Oral  SpO2: 100% 100% 100% 98%  Weight:      Height:        Physical Exam Vitals and nursing note reviewed.  Constitutional:      Appearance: Normal appearance.  HENT:     Head: Normocephalic and atraumatic.  Eyes:     Conjunctiva/sclera: Conjunctivae normal.  Cardiovascular:     Rate and Rhythm: Normal rate and regular rhythm.  Pulmonary:     Effort: Pulmonary effort is normal.     Breath sounds: Normal breath sounds.  Abdominal:     General: Abdomen is flat.     Palpations: Abdomen is soft.  Musculoskeletal:        General: No swelling or tenderness.  Skin:    Coloration: Skin is not jaundiced or pale.  Neurological:     Mental Status: He is alert. Mental status is at baseline.  Psychiatric:        Mood and Affect: Mood normal.        Behavior: Behavior normal.       The results of significant diagnostics from this hospitalization (including imaging, microbiology, ancillary and laboratory) are listed below for reference.     Microbiology: Recent Results (from the past 240 hour(s))  SARS Coronavirus 2 by RT PCR (hospital order, performed in [facility] hospital lab) Nasopharyngeal Nasopharyngeal Swab     Status: None   Collection Time: 03/15/20  1:45 AM   Specimen: Nasopharyngeal Swab  Result Value Ref Range Status   SARS Coronavirus 2 NEGATIVE NEGATIVE Final    Comment: (NOTE) SARS-CoV-2 target  nucleic acids are NOT DETECTED. The SARS-CoV-2 RNA is generally detectable in upper and lower respiratory specimens during the acute phase of infection. The lowest concentration of SARS-CoV-2 viral copies this assay can detect is 250 copies / mL. A negative result does not preclude SARS-CoV-2 infection and should not be used as the sole basis for treatment or other patient management decisions.  A negative result may occur with improper specimen collection / handling, submission of specimen other than nasopharyngeal swab, presence of viral mutation(s) within the areas targeted by this assay, and inadequate number of viral copies (<250 copies / mL). A negative result must be combined with clinical observations, patient history, and epidemiological information. Fact Sheet for Patients:   StrictlyIdeas.no Fact Sheet for Healthcare Providers: BankingDealers.co.za This test is not yet approved or cleared  by the [place] FDA and has been authorized  for detection and/or diagnosis of SARS-CoV-2 by FDA under an Emergency Use Authorization (EUA).  This EUA will remain in effect (meaning this test can be used) for the duration of the COVID-19 declaration under Section 564(b)(1) of the Act, 21 U.S.C. section 360bbb-3(b)(1), unless the authorization is terminated or revoked sooner. Performed at [facility], [address] [place], [place] [address]   MRSA PCR Screening     Status: None   Collection Time: 03/15/20  4:32 AM   Specimen: Nasal Mucosa; Nasopharyngeal  Result Value Ref Range Status   MRSA by PCR NEGATIVE NEGATIVE Final    Comment:        The GeneXpert MRSA Assay (FDA approved for NASAL specimens only), is one component of a comprehensive MRSA colonization surveillance program. It is not intended to diagnose MRSA infection nor to guide or monitor treatment for MRSA infections. Performed at [facility], [place] [address], [place], [place] [address]      Labs: BNP (last 3 results) Recent Labs    10/22/19 0542  BNP 15.7   Basic Metabolic Panel: Recent Labs  Lab 03/17/20 0823 03/17/20 1243 03/18/20 0247 03/18/20 1129 03/19/20 0547 03/19/20 0605  NA 133* 133* 137 133* 132*  --   K 4.0 4.9 3.4* 3.4* 3.2*  --   CL 94* 96* 98 98 94*  --   CO2 17* 20* 23 18* 22  --   GLUCOSE 320* 286* 216* 259* 188*  --   BUN 32* 29* 19 16 9   --   CREATININE 1.82* 1.75* 1.39* 1.29* 0.94  --   CALCIUM 9.6 9.5 9.3 8.8* 9.3  --   MG  --   --  1.9  --   --  1.8   Liver Function Tests: Recent Labs  Lab 03/15/20 0035  AST 19  ALT 24  ALKPHOS 91  BILITOT 2.1*  PROT 8.5*  ALBUMIN 4.4   Recent Labs  Lab 03/15/20 0035  LIPASE 23   No results for input(s): AMMONIA in the last 168 hours. CBC: Recent Labs  Lab 03/15/20 0035 03/15/20 0035 03/15/20 0146 03/15/20 0714 03/17/20 0823 03/18/20 0247 03/19/20 0547  WBC 21.5*  --   --  22.0*  21.3* 13.4* 12.9* 12.4*  NEUTROABS 17.8*  --   --   --   --   --   --   HGB 15.2   < > 17.7* 14.8  14.6 15.0 14.6 13.4  HCT 47.3   < > 52.0 46.0  45.0 46.8 45.2 40.7  MCV 77.5*  --   --  79.9*  77.6* 78.1* 77.0* 77.2*  PLT 422*  --   --  343  400 332 218 306   < > = values in this interval not displayed.   Cardiac Enzymes: No results for input(s): CKTOTAL, CKMB, CKMBINDEX, TROPONINI in the last 168 hours. BNP: Invalid input(s): POCBNP CBG: Recent Labs  Lab 03/18/20 1224 03/18/20 1631 03/18/20 2119 03/19/20 0737 03/19/20 1128  GLUCAP 245* 173* 192* 184* 155*   D-Dimer No results for input(s): DDIMER in the last 72 hours. Hgb A1c No results for input(s): HGBA1C in the last 72 hours. Lipid Profile No results for input(s): CHOL, HDL, LDLCALC, TRIG, CHOLHDL, LDLDIRECT in the last 72 hours. Thyroid function studies No results for input(s): TSH, T4TOTAL, T3FREE, THYROIDAB in the last 72 hours.  Invalid input(s): FREET3 Anemia  work up No results for input(s): VITAMINB12, FOLATE, FERRITIN, TIBC, IRON, RETICCTPCT in  the last 72 hours. Urinalysis    Component Value Date/Time   COLORURINE YELLOW 03/15/2020 0157   APPEARANCEUR CLOUDY (A) 03/15/2020 0157   LABSPEC 1.020 03/15/2020 0157   PHURINE 5.5 03/15/2020 0157   GLUCOSEU >=500 (A) 03/15/2020 0157   HGBUR TRACE (A) 03/15/2020 0157   BILIRUBINUR MODERATE (A) 03/15/2020 0157   KETONESUR >80 (A) 03/15/2020 0157   PROTEINUR 30 (A) 03/15/2020 0157   UROBILINOGEN 0.2 10/21/2019 1258   NITRITE NEGATIVE 03/15/2020 0157   LEUKOCYTESUR NEGATIVE 03/15/2020 0157   Sepsis Labs Invalid input(s): PROCALCITONIN,  WBC,  LACTICIDVEN Microbiology Recent Results (from the past 240 hour(s))  SARS Coronavirus 2 by RT PCR (hospital order, performed in [place] hospital lab) Nasopharyngeal Nasopharyngeal Swab     Status: None   Collection Time: 03/15/20  1:45 AM   Specimen: Nasopharyngeal Swab  Result Value Ref Range Status   SARS Coronavirus 2 NEGATIVE NEGATIVE Final    Comment: (NOTE) SARS-CoV-2 target nucleic acids are NOT DETECTED. The SARS-CoV-2 RNA is generally detectable in upper and lower respiratory specimens during the acute phase of infection. The lowest concentration of SARS-CoV-2 viral copies this assay can detect is 250 copies / mL. A negative result does not preclude SARS-CoV-2 infection and should not be used as the sole basis for treatment or other patient management decisions.  A negative result may occur with improper specimen collection / handling, submission of specimen other than nasopharyngeal swab, presence of viral mutation(s) within the areas targeted by this assay, and inadequate number of viral copies (<250 copies / mL). A negative result must be combined with clinical observations, patient history, and epidemiological information. Fact Sheet for Patients:   StrictlyIdeas.no Fact Sheet for Healthcare  Providers: BankingDealers.co.za This test is not yet approved or cleared  by the [place] FDA and has been authorized for detection and/or diagnosis of SARS-CoV-2 by FDA under an Emergency Use Authorization (EUA).  This EUA will remain in effect (meaning this test can be used) for the duration of the COVID-19 declaration under Section 564(b)(1) of the Act, 21 U.S.C. section 360bbb-3(b)(1), unless the authorization is terminated or revoked sooner. Performed at [facility], [address] [place], [place] [address]   MRSA PCR Screening     Status: None   Collection Time: 03/15/20  4:32 AM   Specimen: Nasal Mucosa; Nasopharyngeal  Result Value Ref Range Status   MRSA by PCR NEGATIVE NEGATIVE Final    Comment:        The GeneXpert MRSA Assay (FDA approved for NASAL specimens only), is one component of a comprehensive MRSA colonization surveillance program. It is not intended to diagnose MRSA infection nor to guide or monitor treatment for MRSA infections. Performed at [facility], [place] [address] [place], [place] [address]     Discharge Instructions     Discharge Instructions    Diet Carb Modified   Complete by: As directed    Diet full liquid   Complete by: As directed    Discharge instructions   Complete by: As directed    -You can take ReliOn insulin 70/30 - 35 units twice daily (before breakfast and before dinner) -For now, adhere to a full liquid to soft diet while your esophagus heals for at least the next 1 to 2 weeks and advance your diet as tolerated.  Make sure you are adhering to a low carbohydrate diet additionally -Check your blood sugar twice daily and bring this log to your  PCP -Continue Protonix 40 mg twice daily 30 minutes prior to meals -Continue Carafate 4 times daily -Get lab work next week and follow-up with your primary care physician If you have any significant change or worsening of your  symptoms please not hesitate contact your primary care physician or return to the ED -   Increase activity slowly   Complete by: As directed      Allergies as of 03/19/2020   No Known Allergies     Medication List    STOP taking these medications   glipiZIDE 10 MG tablet Commonly known as: GLUCOTROL   insulin glargine 100 UNIT/ML injection Commonly known as: LANTUS     TAKE these medications   blood glucose meter kit and supplies Dispense based on patient and insurance preference. Use up to four times daily as directed. (FOR ICD-9 250.00, 250.01).   INSULIN SYRINGE .5CC/31GX5/16" 31G X 5/16" 0.5 ML Misc 1 Units by Does not apply route in the morning and at bedtime. What changed:   how much to take  how to take this  when to take this  additional instructions   lisinopril 20 MG tablet Commonly known as: ZESTRIL Take 1 tablet (20 mg total) by mouth daily.   metFORMIN 1000 MG tablet Commonly known as: GLUCOPHAGE Take 1 tablet (1,000 mg total) by mouth 2 (two) times daily with a meal.   metoprolol tartrate 25 MG tablet Commonly known as: LOPRESSOR Take 1 tablet (25 mg total) by mouth 2 (two) times daily.   NovoLIN 70/30 FlexPen Relion (70-30) 100 UNIT/ML KwikPen Generic drug: insulin isophane & regular human Inject 35 Units into the skin 2 (two) times daily.   pantoprazole 40 MG tablet Commonly known as: PROTONIX Take 1 tablet (40 mg total) by mouth 2 (two) times daily.   pravastatin 20 MG tablet Commonly known as: PRAVACHOL Take 1 tablet (20 mg total) by mouth daily.   sucralfate 1 g tablet Commonly known as: CARAFATE Take 1 tablet (1 g total) by mouth 4 (four) times daily -  with meals and at bedtime.       No Known Allergies  Dispo: The patient is from: Home              Anticipated d/c is to: Home              Anticipated d/c date is: today              Patient currently is medically stable to d/c.       Time coordinating discharge: Over  30 minutes   SIGNED:   Harold Hedge, D.O. [facility] Pager: [phone]  03/19/2020, 4:05 PM

## 2020-03-22 ENCOUNTER — Telehealth: Payer: Self-pay

## 2020-03-22 NOTE — Telephone Encounter (Signed)
Transition Care Management Follow-up Telephone Call Date of discharge and from where: [name] on 03/19/2020 How have you been since you were released from the hospital? Feeling a lot better  Any questions or concerns? None  Items Reviewed: Did the pt receive and understand the discharge instructions provided? YES Medications obtained and verified? Stated were to be picked up today  Any new allergies since your discharge? NONE Dietary orders reviewed?  Yes stressed the importance of adhering modified carb full liquid as instructed by the MD at discharge.  Do you have support at home? Mother  Functional Questionnaire: (I = Independent and D = Dependent) ADLs: I   Follow up appointments reviewed:  PCP Hospital f/u appt confirmed?  Scheduled to see NP Edwards on 03/29/2020 a 10:50am  Specialist Hospital f/u appt confirmed? None Are transportation arrangements needed? NO  If their condition worsens, /is the pt aware to call PCP or go to the Emergency Dept.?  Pt is aware if condition is worsening or start experiencing worsening of symptoms or any of diff breathing, SOB, dizziness, slurred speech, chest pain, extreme fatigue,  Persistent nausea and vomiting, bleeding , rapid weight gain, severe uncontrolled pain, or visual disturbances to return to ED  Was the patient provided with contact information for the PCP's office or ED? YES given.  Was to pt encouraged to call back with questions or concerns?YES name and contact information   Stressed the importance to daily check BS and keep a log and strictly adherence with diet and med regime. Verbalized understanding.

## 2020-03-29 ENCOUNTER — Inpatient Hospital Stay (INDEPENDENT_AMBULATORY_CARE_PROVIDER_SITE_OTHER): Payer: Medicaid Other | Admitting: Primary Care

## 2020-08-07 ENCOUNTER — Observation Stay (HOSPITAL_COMMUNITY): Payer: Medicaid Other

## 2020-08-07 ENCOUNTER — Inpatient Hospital Stay (HOSPITAL_COMMUNITY)
Admission: EM | Admit: 2020-08-07 | Discharge: 2020-08-09 | DRG: 638 | Disposition: A | Discharge status: Home / Self Care | Payer: Self-pay | Attending: Internal Medicine | Admitting: Internal Medicine

## 2020-08-07 ENCOUNTER — Other Ambulatory Visit: Payer: Self-pay

## 2020-08-07 ENCOUNTER — Encounter (HOSPITAL_COMMUNITY): Payer: Self-pay | Admitting: *Deleted

## 2020-08-07 DIAGNOSIS — Z91120 Patient's intentional underdosing of medication regimen due to financial hardship: Secondary | ICD-10-CM

## 2020-08-07 DIAGNOSIS — Z6841 Body Mass Index (BMI) 40.0 and over, adult: Secondary | ICD-10-CM

## 2020-08-07 DIAGNOSIS — T383X6A Underdosing of insulin and oral hypoglycemic [antidiabetic] drugs, initial encounter: Secondary | ICD-10-CM | POA: Diagnosis present

## 2020-08-07 DIAGNOSIS — R0602 Shortness of breath: Secondary | ICD-10-CM

## 2020-08-07 DIAGNOSIS — N179 Acute kidney failure, unspecified: Secondary | ICD-10-CM | POA: Diagnosis present

## 2020-08-07 DIAGNOSIS — E1310 Other specified diabetes mellitus with ketoacidosis without coma: Principal | ICD-10-CM

## 2020-08-07 DIAGNOSIS — E876 Hypokalemia: Secondary | ICD-10-CM | POA: Diagnosis present

## 2020-08-07 DIAGNOSIS — E1010 Type 1 diabetes mellitus with ketoacidosis without coma: Principal | ICD-10-CM | POA: Diagnosis present

## 2020-08-07 DIAGNOSIS — Z20822 Contact with and (suspected) exposure to covid-19: Secondary | ICD-10-CM | POA: Diagnosis present

## 2020-08-07 DIAGNOSIS — E1110 Type 2 diabetes mellitus with ketoacidosis without coma: Secondary | ICD-10-CM | POA: Diagnosis present

## 2020-08-07 LAB — CBG MONITORING, ED
Glucose-Capillary: 425 mg/dL — ABNORMAL HIGH (ref 70–99)
Glucose-Capillary: 402 mg/dL — ABNORMAL HIGH (ref 70–99)
Glucose-Capillary: 343 mg/dL — ABNORMAL HIGH (ref 70–99)
Glucose-Capillary: 209 mg/dL — ABNORMAL HIGH (ref 70–99)
Glucose-Capillary: 147 mg/dL — ABNORMAL HIGH (ref 70–99)
Glucose-Capillary: 179 mg/dL — ABNORMAL HIGH (ref 70–99)
Glucose-Capillary: 205 mg/dL — ABNORMAL HIGH (ref 70–99)
Glucose-Capillary: 213 mg/dL — ABNORMAL HIGH (ref 70–99)
Glucose-Capillary: 229 mg/dL — ABNORMAL HIGH (ref 70–99)
Glucose-Capillary: 189 mg/dL — ABNORMAL HIGH (ref 70–99)
Glucose-Capillary: 217 mg/dL — ABNORMAL HIGH (ref 70–99)
Glucose-Capillary: 238 mg/dL — ABNORMAL HIGH (ref 70–99)

## 2020-08-07 LAB — GLUCOSE, CAPILLARY
Glucose-Capillary: 196 mg/dL — ABNORMAL HIGH (ref 70–99)
Glucose-Capillary: 216 mg/dL — ABNORMAL HIGH (ref 70–99)
Glucose-Capillary: 243 mg/dL — ABNORMAL HIGH (ref 70–99)
Glucose-Capillary: 220 mg/dL — ABNORMAL HIGH (ref 70–99)
Glucose-Capillary: 190 mg/dL — ABNORMAL HIGH (ref 70–99)
Glucose-Capillary: 207 mg/dL — ABNORMAL HIGH (ref 70–99)
Glucose-Capillary: 210 mg/dL — ABNORMAL HIGH (ref 70–99)

## 2020-08-07 LAB — BASIC METABOLIC PANEL
Anion gap: 20 — ABNORMAL HIGH (ref 5–15)
Anion gap: 18 — ABNORMAL HIGH (ref 5–15)
Anion gap: 17 — ABNORMAL HIGH (ref 5–15)
BUN: 5 mg/dL — ABNORMAL LOW (ref 6–20)
BUN: <5 mg/dL — ABNORMAL LOW (ref 6–20)
BUN: <5 mg/dL — ABNORMAL LOW (ref 6–20)
CO2: 14 mmol/L — ABNORMAL LOW (ref 22–32)
CO2: 15 mmol/L — ABNORMAL LOW (ref 22–32)
CO2: 16 mmol/L — ABNORMAL LOW (ref 22–32)
Calcium: 10 mg/dL (ref 8.9–10.3)
Calcium: 9.7 mg/dL (ref 8.9–10.3)
Calcium: 9.8 mg/dL (ref 8.9–10.3)
Chloride: 109 mmol/L (ref 98–111)
Chloride: 109 mmol/L (ref 98–111)
Chloride: 110 mmol/L (ref 98–111)
Creatinine, Ser: 1.26 mg/dL — ABNORMAL HIGH (ref 0.61–1.24)
Creatinine, Ser: 1.14 mg/dL (ref 0.61–1.24)
Creatinine, Ser: 1.17 mg/dL (ref 0.61–1.24)
GFR, Estimated: >60 mL/min (ref >60)
GFR, Estimated: >60 mL/min (ref >60)
GFR, Estimated: >60 mL/min (ref >60)
Glucose, Bld: 181 mg/dL — ABNORMAL HIGH (ref 70–99)
Glucose, Bld: 211 mg/dL — ABNORMAL HIGH (ref 70–99)
Glucose, Bld: 229 mg/dL — ABNORMAL HIGH (ref 70–99)
Potassium: 3.8 mmol/L (ref 3.5–5.1)
Potassium: 4.1 mmol/L (ref 3.5–5.1)
Potassium: 3.5 mmol/L (ref 3.5–5.1)
Sodium: 143 mmol/L (ref 135–145)
Sodium: 142 mmol/L (ref 135–145)
Sodium: 143 mmol/L (ref 135–145)

## 2020-08-07 LAB — RESPIRATORY PANEL BY RT PCR (FLU A&B, COVID)
Influenza A by PCR: NEGATIVE
Influenza B by PCR: NEGATIVE
SARS Coronavirus 2 by RT PCR: NEGATIVE

## 2020-08-07 LAB — URINALYSIS, ROUTINE W REFLEX MICROSCOPIC
Bacteria, UA: NONE SEEN
Bilirubin Urine: NEGATIVE
Glucose, UA: >=500 mg/dL — AB
Ketones, ur: 80 mg/dL — AB
Leukocytes,Ua: NEGATIVE
Nitrite: NEGATIVE
Protein, ur: >=300 mg/dL — AB
Specific Gravity, Urine: 1.026 (ref 1.005–1.030)
pH: 5 (ref 5.0–8.0)

## 2020-08-07 LAB — RAPID URINE DRUG SCREEN, HOSP PERFORMED
Amphetamines: NOT DETECTED
Barbiturates: NOT DETECTED
Benzodiazepines: NOT DETECTED
Cocaine: NOT DETECTED
Opiates: NOT DETECTED
Tetrahydrocannabinol: NOT DETECTED

## 2020-08-07 LAB — I-STAT VENOUS BLOOD GAS, ED
Acid-base deficit: 14 mmol/L — ABNORMAL HIGH (ref 0.0–2.0)
Bicarbonate: 11.9 mmol/L — ABNORMAL LOW (ref 20.0–28.0)
Calcium, Ion: 1.24 mmol/L (ref 1.15–1.40)
HCT: 57 % — ABNORMAL HIGH (ref 39.0–52.0)
Hemoglobin: 19.4 g/dL — ABNORMAL HIGH (ref 13.0–17.0)
O2 Saturation: 51 %
Patient temperature: 37
Potassium: 3.8 mmol/L (ref 3.5–5.1)
Sodium: 141 mmol/L (ref 135–145)
TCO2: 13 mmol/L — ABNORMAL LOW (ref 22–32)
pCO2, Ven: 30 mmHg — ABNORMAL LOW (ref 44.0–60.0)
pH, Ven: 7.205 — ABNORMAL LOW (ref 7.250–7.430)
pO2, Ven: 32 mmHg (ref 32.0–45.0)

## 2020-08-07 LAB — COMPREHENSIVE METABOLIC PANEL
ALT: 29 U/L (ref 0–44)
AST: 19 U/L (ref 15–41)
Albumin: 4.6 g/dL (ref 3.5–5.0)
Alkaline Phosphatase: 127 U/L — ABNORMAL HIGH (ref 38–126)
Anion gap: 23 — ABNORMAL HIGH (ref 5–15)
BUN: 6 mg/dL (ref 6–20)
CO2: 12 mmol/L — ABNORMAL LOW (ref 22–32)
Calcium: 10.3 mg/dL (ref 8.9–10.3)
Chloride: 103 mmol/L (ref 98–111)
Creatinine, Ser: 1.51 mg/dL — ABNORMAL HIGH (ref 0.61–1.24)
GFR, Estimated: >60 mL/min (ref >60)
Glucose, Bld: 431 mg/dL — ABNORMAL HIGH (ref 70–99)
Potassium: 3.8 mmol/L (ref 3.5–5.1)
Sodium: 138 mmol/L (ref 135–145)
Total Bilirubin: 1.1 mg/dL (ref 0.3–1.2)
Total Protein: 9.1 g/dL — ABNORMAL HIGH (ref 6.5–8.1)

## 2020-08-07 LAB — CBC
HCT: 56.3 % — ABNORMAL HIGH (ref 39.0–52.0)
Hemoglobin: 18.1 g/dL — ABNORMAL HIGH (ref 13.0–17.0)
MCH: 24.7 pg — ABNORMAL LOW (ref 26.0–34.0)
MCHC: 32.1 g/dL (ref 30.0–36.0)
MCV: 76.8 fL — ABNORMAL LOW (ref 80.0–100.0)
Platelets: 420 10*3/uL — ABNORMAL HIGH (ref 150–400)
RBC: 7.33 MIL/uL — ABNORMAL HIGH (ref 4.22–5.81)
RDW: 16.7 % — ABNORMAL HIGH (ref 11.5–15.5)
WBC: 13.8 10*3/uL — ABNORMAL HIGH (ref 4.0–10.5)
nRBC: 0 % (ref 0.0–0.2)

## 2020-08-07 LAB — BETA-HYDROXYBUTYRIC ACID: Beta-Hydroxybutyric Acid: 5.04 mmol/L — ABNORMAL HIGH (ref 0.05–0.27)

## 2020-08-07 LAB — HEMOGLOBIN A1C
Hgb A1c MFr Bld: 12.5 % — ABNORMAL HIGH (ref 4.8–5.6)
Mean Plasma Glucose: 312.05 mg/dL

## 2020-08-07 LAB — LIPASE, BLOOD: Lipase: 23 U/L (ref 11–51)

## 2020-08-07 LAB — LACTIC ACID, PLASMA: Lactic Acid, Venous: 3.2 mmol/L (ref 0.5–1.9)

## 2020-08-07 LAB — TROPONIN I (HIGH SENSITIVITY): Troponin I (High Sensitivity): 5 ng/L (ref <18)

## 2020-08-07 MED ORDER — DEXTROSE IN LACTATED RINGERS 5 % IV SOLN: INTRAVENOUS | Status: DC

## 2020-08-07 MED ORDER — DEXTROSE 50 % IV SOLN: 0.0000 mL | INTRAVENOUS | Status: DC | PRN

## 2020-08-07 MED ORDER — LACTATED RINGERS IV SOLN: INTRAVENOUS | Status: DC

## 2020-08-07 MED ORDER — ENOXAPARIN SODIUM 40 MG/0.4ML ~~LOC~~ SOLN: 40.0000 mg | SUBCUTANEOUS | Status: DC

## 2020-08-07 MED ORDER — ENOXAPARIN SODIUM 100 MG/ML ~~LOC~~ SOLN
100.0000 mg | SUBCUTANEOUS | Status: DC
  Administered 2020-08-08 – 2020-08-09 (×2): 100 mg via SUBCUTANEOUS
  Filled 2020-08-07 (×2): qty 1

## 2020-08-07 MED ORDER — POTASSIUM CHLORIDE 10 MEQ/100ML IV SOLN
10.0000 meq | INTRAVENOUS | Status: AC
  Administered 2020-08-07 (×2): 10 meq via INTRAVENOUS
  Filled 2020-08-07 (×2): qty 100

## 2020-08-07 MED ORDER — INSULIN REGULAR(HUMAN) IN NACL 100-0.9 UT/100ML-% IV SOLN
INTRAVENOUS | Status: DC
  Filled 2020-08-07: qty 100

## 2020-08-07 MED ORDER — INSULIN REGULAR(HUMAN) IN NACL 100-0.9 UT/100ML-% IV SOLN
INTRAVENOUS | Status: DC
  Administered 2020-08-07: 15 [IU]/h via INTRAVENOUS
  Administered 2020-08-07: 6 [IU]/h via INTRAVENOUS
  Filled 2020-08-07 (×3): qty 100

## 2020-08-07 MED ORDER — LACTATED RINGERS IV BOLUS
3000.0000 mL | Freq: Once | INTRAVENOUS | Status: AC
  Administered 2020-08-07: 3000 mL via INTRAVENOUS

## 2020-08-07 MED ORDER — LABETALOL HCL 5 MG/ML IV SOLN: 10.0000 mg | INTRAVENOUS | Status: DC | PRN

## 2020-08-07 NOTE — ED Notes (Signed)
MD Ghimire told Nurse that pt can have intake on an advanced diet. MD requested that Nurse contact diabetes coordinator to cover additional intake. Nurse paged coordinator. RN waiting for response.

## 2020-08-07 NOTE — ED Provider Notes (Signed)
[place] EMERGENCY DEPARTMENT Provider Note   CSN: [id number] Arrival date & time: 08/07/20  0310     History Chief Complaint  Patient presents with  . Shortness of Breath    Michael [name] is a 24 y.o. male.  Patient states noncompliance with his diabetic medications for at least last couple months.  He has had general unwell feeling for last couple days to include nausea and vomiting.  Also some crampy abdominal pain is often relieved by vomiting.  No fevers.  No cough.  No diarrhea or constipation.  He states he has been breathing fast he also feels like he is dehydrated.  States this is similar to previous episodes of having high blood sugar.   Shortness of Breath      Past Medical History:  Diagnosis Date  . Diabetes mellitus without complication [facility])     Patient Active Problem List   Diagnosis Date Noted  . DKA (diabetic ketoacidosis) ([place]) 08/07/2020  . AKI (acute kidney injury) ([place]) 03/15/2020  . Nausea & vomiting 03/15/2020  . DKA (diabetic ketoacidoses) 10/21/2019  . Sports physical 08/12/2012    History reviewed. No pertinent surgical history.     Family History  Problem Relation Age of Onset  . Sudden death Neg Hx   . Heart attack Neg Hx     Social History   Tobacco Use  . Smoking status: Never Smoker  . Smokeless tobacco: Never Used  Substance Use Topics  . Alcohol use: Not Currently  . Drug use: Not Currently    Home Medications Prior to Admission medications   Medication Sig Start Date End Date Taking? Authorizing Provider  blood glucose meter kit and supplies Dispense based on patient and insurance preference. Use up to four times daily as directed. (FOR ICD-9 250.00, 250.01). 10/25/19  Yes Lama, Marge Duncans, MD  insulin isophane & regular human (NOVOLIN 70/30 FLEXPEN RELION) (70-30) 100 UNIT/ML KwikPen Inject 35 Units into the skin 2 (two) times daily. 03/19/20  Yes Harold Hedge, MD  lisinopril (ZESTRIL) 20 MG tablet  Take 1 tablet (20 mg total) by mouth daily. 11/18/19  Yes Kerin Perna, NP  metFORMIN (GLUCOPHAGE) 1000 MG tablet Take 1 tablet (1,000 mg total) by mouth 2 (two) times daily with a meal. 11/18/19  Yes Kerin Perna, NP  metoprolol tartrate (LOPRESSOR) 25 MG tablet Take 1 tablet (25 mg total) by mouth 2 (two) times daily. 03/19/20 08/26/21 Yes Harold Hedge, MD  pantoprazole (PROTONIX) 40 MG tablet Take 1 tablet (40 mg total) by mouth 2 (two) times daily. 03/19/20 08/03/21 Yes Harold Hedge, MD  pravastatin (PRAVACHOL) 20 MG tablet Take 1 tablet (20 mg total) by mouth daily. 03/19/20  Yes Harold Hedge, MD  sucralfate (CARAFATE) 1 g tablet Take 1 tablet (1 g total) by mouth 4 (four) times daily -  with meals and at bedtime. 03/19/20 04/18/20  Harold Hedge, MD    Allergies    Patient has no known allergies.  Review of Systems   Review of Systems  Respiratory: Positive for shortness of breath.   All other systems reviewed and are negative.   Physical Exam Updated Vital Signs BP 112/87 (BP Location: Right Arm)   Pulse (!) 137   Temp 97.8 F (36.6 C) (Oral)   Resp 18   Ht 6' 2"  (1.88 m)   Wt (!) 195 kg   SpO2 92%   BMI 55.21 kg/m   Physical Exam Vitals  and nursing note reviewed.  Constitutional:      Appearance: He is well-developed.  HENT:     Head: Normocephalic and atraumatic.     Mouth/Throat:     Mouth: Mucous membranes are dry.     Pharynx: Oropharynx is clear.  Cardiovascular:     Rate and Rhythm: Tachycardia present.  Pulmonary:     Effort: Pulmonary effort is normal. Tachypnea present. No respiratory distress.     Breath sounds: No decreased breath sounds or wheezing.  Abdominal:     General: There is no distension.  Musculoskeletal:        General: Normal range of motion.     Cervical back: Normal range of motion.  Skin:    General: Skin is warm and dry.  Neurological:     General: No focal deficit present.     Mental Status: He is alert.     ED  Results / Procedures / Treatments   Labs (all labs ordered are listed, but only abnormal results are displayed) Labs Reviewed  COMPREHENSIVE METABOLIC PANEL - Abnormal; Notable for the following components:      Result Value   CO2 12 (*)    Glucose, Bld 431 (*)    Creatinine, Ser 1.51 (*)    Total Protein 9.1 (*)    Alkaline Phosphatase 127 (*)    Anion gap 23 (*)    All other components within normal limits  CBC - Abnormal; Notable for the following components:   WBC 13.8 (*)    RBC 7.33 (*)    Hemoglobin 18.1 (*)    HCT 56.3 (*)    MCV 76.8 (*)    MCH 24.7 (*)    RDW 16.7 (*)    Platelets 420 (*)    All other components within normal limits  URINALYSIS, ROUTINE W REFLEX MICROSCOPIC - Abnormal; Notable for the following components:   APPearance HAZY (*)    Glucose, UA >=500 (*)    Hgb urine dipstick SMALL (*)    Ketones, ur 80 (*)    Protein, ur >=300 (*)    All other components within normal limits  LACTIC ACID, PLASMA - Abnormal; Notable for the following components:   Lactic Acid, Venous 3.2 (*)    All other components within normal limits  CBG MONITORING, ED - Abnormal; Notable for the following components:   Glucose-Capillary 425 (*)    All other components within normal limits  I-STAT VENOUS BLOOD GAS, ED - Abnormal; Notable for the following components:   pH, Ven 7.205 (*)    pCO2, Ven 30.0 (*)    Bicarbonate 11.9 (*)    TCO2 13 (*)    Acid-base deficit 14.0 (*)    HCT 57.0 (*)    Hemoglobin 19.4 (*)    All other components within normal limits  RESPIRATORY PANEL BY RT PCR (FLU A&B, COVID)  LIPASE, BLOOD  CBC  BASIC METABOLIC PANEL  BASIC METABOLIC PANEL  BASIC METABOLIC PANEL  BASIC METABOLIC PANEL  BASIC METABOLIC PANEL  BETA-HYDROXYBUTYRIC ACID  BETA-HYDROXYBUTYRIC ACID  BETA-HYDROXYBUTYRIC ACID  HEMOGLOBIN A1C  RAPID URINE DRUG SCREEN, HOSP PERFORMED  CBG MONITORING, ED  TROPONIN I (HIGH SENSITIVITY)    EKG EKG  Interpretation  Date/Time:  Saturday August 07 2020 04:12:02 EDT Ventricular Rate:  135 PR Interval:    QRS Duration: 78 QT Interval:  272 QTC Calculation: 408 R Axis:   74 Text Interpretation: Sinus tachycardia Low voltage, precordial leads Nonspecific T abnormalities, diffuse leads  When compared with ECG of 03/14/2020, No significant change was found Confirmed by Delora Fuel ([id number]) on 08/07/2020 6:27:09 AM   Radiology No results found.  Procedures .Critical Care Performed by: Merrily Pew, MD Authorized by: Merrily Pew, MD   Critical care provider statement:    Critical care time (minutes):  45   Critical care was necessary to treat or prevent imminent or life-threatening deterioration of the following conditions:  Endocrine crisis   Critical care was time spent personally by me on the following activities:  Discussions with consultants, evaluation of patient's response to treatment, examination of patient, ordering and performing treatments and interventions, ordering and review of laboratory studies, ordering and review of radiographic studies, pulse oximetry, re-evaluation of patient's condition, obtaining history from patient or surrogate and review of old charts   (including critical care time)  Medications Ordered in ED Medications  potassium chloride 10 mEq in 100 mL IVPB (10 mEq Intravenous New Bag/Given 08/07/20 0609)  enoxaparin (LOVENOX) injection 40 mg (40 mg Subcutaneous Not Given 08/07/20 0610)  insulin regular, human (MYXREDLIN) 100 units/ 100 mL infusion (15 Units/hr Intravenous New Bag/Given 08/07/20 0608)  lactated ringers infusion (has no administration in time range)  dextrose 5 % in lactated ringers infusion (0 mLs Intravenous Hold 08/07/20 0609)  dextrose 50 % solution 0-50 mL (has no administration in time range)  labetalol (NORMODYNE) injection 10 mg (has no administration in time range)  lactated ringers bolus 3,000 mL (3,000 mLs Intravenous New  Bag/Given 08/07/20 0603)    ED Course  I have reviewed the triage vital signs and the nursing notes.  Pertinent labs & imaging results that were available during my care of the patient were reviewed by me and considered in my medical decision making (see chart for details).    MDM Rules/Calculators/A&P                          Here with moderate DKA secondary to noncompliance.  Low suspicion for any other acute causes for his hyperglycemia at this time.  Potassium slightly low at 3.8 so we will replete that with his insulin.  3 L of fluids ordered.  Discussed with hospitalist for stepdown admission.  Final Clinical Impression(s) / ED Diagnoses Final diagnoses:  Diabetic ketoacidosis without coma associated with other specified diabetes mellitus [facility])    Rx / DC Orders ED Discharge Orders    None       Audiel Scheiber, Corene Cornea, MD 08/07/20 [phone]

## 2020-08-07 NOTE — Progress Notes (Signed)
Inpatient Diabetes Program Recommendations  AACE/ADA: New Consensus Statement on Inpatient Glycemic Control   Target Ranges:  Prepandial:   less than 140 mg/dL      Peak postprandial:   less than 180 mg/dL (1-2 hours)      Critically ill patients:  140 - 180 mg/dL  Results for FEWAlfreddie, Consalvo IV (MRN [id number]) as of 08/07/2020 12:50  Ref. Range 08/07/2020 03:35 08/07/2020 06:02 08/07/2020 06:44 08/07/2020 07:47 08/07/2020 08:46 08/07/2020 09:54 08/07/2020 10:58 08/07/2020 12:04  Glucose-Capillary Latest Ref Range: 70 - 99 mg/dL 149 (H) 702 (H) 637 (H) 209 (H) 147 (H) 179 (H) 205 (H) 213 (H)  Results for Rothrock, JASPER IV (MRN [id number]) as of 08/07/2020 12:50  Ref. Range 08/07/2020 03:49 08/07/2020 09:46  CO2 Latest Ref Range: 22 - 32 mmol/L 12 (L) 14 (L)  Glucose Latest Ref Range: 70 - 99 mg/dL 412 (H) 878 (H)  Anion gap Latest Ref Range: 5 - 15  23 (H) 20 (H)     Review of Glycemic Control  Diabetes history: DM2 Outpatient Diabetes medications: 70/30 35 units BID, Metformin 1000 mg BID Current orders for Inpatient glycemic control: IV insulin  Inpatient Diabetes Program Recommendations:    Insulin: IV insulin should be continued until acidosis has resolved. Once acidosis is completely resolved and MD is ready to transition to SQ insulin please consider ordering 70/30 30 units BID, CBGs Q4H, Novolog 0-20 units Q4H.  NOTE: Noted consult for Diabetes Coordinator. Diabetes Coordinator is not on campus over the weekend but available by pager from 8am to 5pm for questions or concerns. Chart reviewed. Per chart, patient was inpatient 10/21/19-10/25/19 and was dx with DM at that time and patient was seen by inpatient diabetes coordinator 2 times and was discharged on Lantus 30 units daily and Metformin. Patient was admitted again 03/15/20-03/19/20 and was seen patient inpatient diabetes coordinator on 03/17/20 and 03/19/20 during that hospitalization. Patient was discharged on 70/30 35 units BID and  Metfomrin 1000 mg BID on 03/19/20. Per H&P on 08/07/20, patient had not taken DM medication for last 2 months. Will plan to have diabetes coordinator follow up with patient on Monday.  Thanks, Orlando Penner, RN, MSN, CDE Diabetes Coordinator Inpatient Diabetes Program [phone] (Team Pager from 8am to 5pm)

## 2020-08-07 NOTE — ED Notes (Signed)
Patient CBG was 229.

## 2020-08-07 NOTE — ED Notes (Signed)
Patient CBG was 213

## 2020-08-07 NOTE — ED Triage Notes (Signed)
The pt is a diabetic and he is sob he thinks that he is dehydrated with abd pain nausea vomiting for 2-3 days

## 2020-08-07 NOTE — ED Notes (Signed)
Vomiting in triage 

## 2020-08-07 NOTE — ED Triage Notes (Signed)
Strong odor of ketones on his breath

## 2020-08-07 NOTE — ED Notes (Signed)
Patient CBG was 179. 

## 2020-08-07 NOTE — Progress Notes (Signed)
PROGRESS NOTE        PATIENT DETAILS Name: Michael [name] Age: 24 y.o. Sex: male Date of Birth: 1996/03/13 Admit Date: 08/07/2020 Admitting Physician Eduard Clos, MD [id number], Kinnie Scales, NP  Brief Narrative: Patient is a 24 y.o. male with history of DM-one-noncompliant to medications-stopped taking insulin 2 months back-presenting with nausea/vomiting for 2 days-found to have diabetic ketoacidosis.  Significant events: 10/30>> presenting with nausea/vomiting-found to have DKA.  Significant studies: 10/30>> chest x-ray: No abnormalities.  Antimicrobial therapy: None  Microbiology data: None  Procedures : None  Consults: None  DVT Prophylaxis : enoxaparin (LOVENOX) injection 40 mg Start: 08/07/20 0600   Subjective: No vomiting-still with some nausea.  Assessment/Plan: DKA: Noncompliant with insulin for the past 2 months-continue [name] fluids/[name] insulin until anion gap closes.  AKI: Mild-likely hemodynamically mediated due to DKA-resolving.  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  Counseled extensively.  Once DKA resolves- place back on 30 units of 70/30 insulin twice daily.  ?  History of hypertension: BP stable-not on any antihypertensives for now-follow and start accordingly.  Morbid Obesity: Estimated body mass index is 55.21 kg/m as calculated from the following:   Height as of this encounter: 6\' 2"  (1.88 m).   Weight as of this encounter: 195 kg.   Diet: Diet Order            Diet NPO time specified  Diet effective now                  Code Status: Full code   Family Communication: Patient prefers to update family himself.   Disposition Plan: Status is: Observation  The patient remains OBS appropriate and will d/c before 2 midnights.  Dispo: The patient is from: Home              Anticipated d/c is to: Home               Anticipated d/c date is: 1 day              Patient currently is not medically stable to d/c.   Barriers to Discharge: DKA on [name] insulin  Antimicrobial agents: Anti-infectives (From admission, onward)   None       Time spent: 25-minutes-Greater than 50% of this time was spent in counseling, explanation of diagnosis, planning of further management, and coordination of care.  MEDICATIONS: Scheduled Meds: . enoxaparin (LOVENOX) injection  40 mg Subcutaneous Q24H   Continuous Infusions: . dextrose 5% lactated ringers 125 mL/hr at 08/07/20 0758  . insulin 8 Units/hr (08/07/20 1304)  . lactated ringers Stopped (08/07/20 1109)   PRN Meds:.dextrose, labetalol   PHYSICAL EXAM: Vital signs: Vitals:   08/07/20 0815 08/07/20 0830 08/07/20 1151 08/07/20 1215  BP: 124/75 102/71 (!) 145/82 (!) 111/52  Pulse: (!) 123 (!) 123 (!) 110 (!) 116  Resp: (!) 22 (!) 23 (!) 24 17  Temp:      TempSrc:      SpO2: 99% 98% 98% 100%  Weight:      Height:       Filed Weights   08/07/20 0326  Weight: (!) 195 kg   Body mass index is 55.21 kg/m.   Gen Exam:Alert awake-not in any distress HEENT:atraumatic, normocephalic Chest: B/L clear to auscultation  anteriorly CVS:S1S2 regular Abdomen:soft non tender, non distended Extremities:no edema Neurology: Non focal Skin: no rash  I have personally reviewed following labs and imaging studies  LABORATORY DATA: CBC: Recent Labs  Lab 08/07/20 0349 08/07/20 0452  WBC 13.8*  --   HGB 18.1* 19.4*  HCT 56.3* 57.0*  MCV 76.8*  --   PLT 420*  --     Basic Metabolic Panel: Recent Labs  Lab 08/07/20 0349 08/07/20 0452 08/07/20 0946  NA 138 141 143  K 3.8 3.8 3.8  CL 103  --  109  CO2 12*  --  14*  GLUCOSE 431*  --  181*  BUN 6  --  5*  CREATININE 1.51*  --  1.26*  CALCIUM 10.3  --  10.0    GFR: Estimated Creatinine Clearance: 164.2 mL/min (A) (by C-G formula based on SCr of 1.26 mg/dL (H)).  Liver Function Tests: Recent  Labs  Lab 08/07/20 0349  AST 19  ALT 29  ALKPHOS 127*  BILITOT 1.1  PROT 9.1*  ALBUMIN 4.6   Recent Labs  Lab 08/07/20 0349  LIPASE 23   No results for input(s): AMMONIA in the last 168 hours.  Coagulation Profile: No results for input(s): INR, PROTIME in the last 168 hours.  Cardiac Enzymes: No results for input(s): CKTOTAL, CKMB, CKMBINDEX, TROPONINI in the last 168 hours.  BNP (last 3 results) No results for input(s): PROBNP in the last 8760 hours.  Lipid Profile: No results for input(s): CHOL, HDL, LDLCALC, TRIG, CHOLHDL, LDLDIRECT in the last 72 hours.  Thyroid Function Tests: No results for input(s): TSH, T4TOTAL, FREET4, T3FREE, THYROIDAB in the last 72 hours.  Anemia Panel: No results for input(s): VITAMINB12, FOLATE, FERRITIN, TIBC, IRON, RETICCTPCT in the last 72 hours.  Urine analysis:    Component Value Date/Time   COLORURINE YELLOW 08/07/2020 0348   APPEARANCEUR HAZY (A) 08/07/2020 0348   LABSPEC 1.026 08/07/2020 0348   PHURINE 5.0 08/07/2020 0348   GLUCOSEU >=500 (A) 08/07/2020 0348   HGBUR SMALL (A) 08/07/2020 0348   BILIRUBINUR NEGATIVE 08/07/2020 0348   KETONESUR 80 (A) 08/07/2020 0348   PROTEINUR >=300 (A) 08/07/2020 0348   UROBILINOGEN 0.2 10/21/2019 1258   NITRITE NEGATIVE 08/07/2020 0348   LEUKOCYTESUR NEGATIVE 08/07/2020 0348    Sepsis Labs: Lactic Acid, Venous    Component Value Date/Time   LATICACIDVEN 3.2 (HH) 08/07/2020 0349    MICROBIOLOGY: Recent Results (from the past 240 hour(s))  Respiratory Panel by RT PCR (Flu A&B, Covid) - Nasopharyngeal Swab     Status: None   Collection Time: 08/07/20  6:45 AM   Specimen: Nasopharyngeal Swab  Result Value Ref Range Status   SARS Coronavirus 2 by RT PCR NEGATIVE NEGATIVE Final    Comment: (NOTE) SARS-CoV-2 target nucleic acids are NOT DETECTED.  The SARS-CoV-2 RNA is generally detectable in upper respiratoy specimens during the acute phase of infection. The lowest concentration  of SARS-CoV-2 viral copies this assay can detect is 131 copies/mL. A negative result does not preclude SARS-Cov-2 infection and should not be used as the sole basis for treatment or other patient management decisions. A negative result may occur with  improper specimen collection/handling, submission of specimen other than nasopharyngeal swab, presence of viral mutation(s) within the areas targeted by this assay, and inadequate number of viral copies (<131 copies/mL). A negative result must be combined with clinical observations, patient history, and epidemiological information. The expected result is Negative.  Fact Sheet for Patients:  https://www.moore.com/  Fact Sheet for Healthcare Providers:  https://www.young.biz/  This test is no t yet approved or cleared by the [place] FDA and  has been authorized for detection and/or diagnosis of SARS-CoV-2 by FDA under an Emergency Use Authorization (EUA). This EUA will remain  in effect (meaning this test can be used) for the duration of the COVID-19 declaration under Section 564(b)(1) of the Act, 21 U.S.C. section 360bbb-3(b)(1), unless the authorization is terminated or revoked sooner.     Influenza A by PCR NEGATIVE NEGATIVE Final   Influenza B by PCR NEGATIVE NEGATIVE Final    Comment: (NOTE) The Xpert Xpress SARS-CoV-2/FLU/RSV assay is intended as an aid in  the diagnosis of influenza from Nasopharyngeal swab specimens and  should not be used as a sole basis for treatment. Nasal washings and  aspirates are unacceptable for Xpert Xpress SARS-CoV-2/FLU/RSV  testing.  Fact Sheet for Patients: https://www.moore.com/  Fact Sheet for Healthcare Providers: https://www.young.biz/  This test is not yet approved or cleared by the [place] FDA and  has been authorized for detection and/or diagnosis of SARS-CoV-2 by  FDA under an Emergency Use  Authorization (EUA). This EUA will remain  in effect (meaning this test can be used) for the duration of the  Covid-19 declaration under Section 564(b)(1) of the Act, 21  U.S.C. section 360bbb-3(b)(1), unless the authorization is  terminated or revoked. Performed at [facility], [address] [place], [place] [address]     RADIOLOGY STUDIES/RESULTS: DG CHEST PORT 1 VIEW  Result Date: 08/07/2020 CLINICAL DATA:  Shortness of breath EXAM: PORTABLE CHEST 1 VIEW COMPARISON:  03/15/2020 FINDINGS: Low lung volumes. No new consolidation or edema. No pleural effusion or pneumothorax. Cardiomediastinal contours are within normal limits. IMPRESSION: Low lung volumes.  No acute process in the chest. Electronically Signed   By: Guadlupe Spanish M.D.   On: 08/07/2020 07:12     LOS: 0 days   Jeoffrey Massed, MD  [facility]    To contact the attending provider between 7A-7P or the covering provider during after hours 7P-7A, please log into the web site [email or web address] and access using universal [place] password for that web site. If you do not have the password, please call the hospital operator.  08/07/2020, 1:04 PM

## 2020-08-07 NOTE — H&P (Signed)
History and Physical    Michael [name] GBE:[id number] DOB: 1996-01-31 DOA: 08/07/2020  PCP: Kerin Perna, NP  Patient coming from: Home.  Chief Complaint: Nausea vomiting.  HPI: Michael [name] is a 24 y.o. male with history of diabetes mellitus type 2, morbid obesity, hypertension presents to the ER with complaint of persistent nausea vomiting for the last 2 days.  Denies any abdominal pain chest pain has been having some mild shortness of breath.  Has not taken his diabetic medications for the last 2 months.  ED Course: In the ER patient is found to be tachycardic.  Labs show blood glucose of 431 bicarb of 12 anion gap of 23.  Urine shows ketones.  EKG shows sinus tachycardia.  Covid test is pending.  Patient started on [name] insulin and fluid admitted for DKA.  Review of Systems: As per HPI, rest all negative.   Past Medical History:  Diagnosis Date  . Diabetes mellitus without complication ([place])     History reviewed. No pertinent surgical history.   reports that he has never smoked. He has never used smokeless tobacco. He reports previous alcohol use. He reports previous drug use.  No Known Allergies  Family History  Problem Relation Age of Onset  . Sudden death Neg Hx   . Heart attack Neg Hx     Prior to Admission medications   Medication Sig Start Date End Date Taking? Authorizing Provider  blood glucose meter kit and supplies Dispense based on patient and insurance preference. Use up to four times daily as directed. (FOR ICD-9 250.00, 250.01). 10/25/19  Yes Lama, Marge Duncans, MD  insulin isophane & regular human (NOVOLIN 70/30 FLEXPEN RELION) (70-30) 100 UNIT/ML KwikPen Inject 35 Units into the skin 2 (two) times daily. 03/19/20  Yes Harold Hedge, MD  lisinopril (ZESTRIL) 20 MG tablet Take 1 tablet (20 mg total) by mouth daily. 11/18/19  Yes Kerin Perna, NP  metFORMIN (GLUCOPHAGE) 1000 MG tablet Take 1 tablet (1,000 mg total) by mouth 2 (two) times daily with a  meal. 11/18/19  Yes Kerin Perna, NP  metoprolol tartrate (LOPRESSOR) 25 MG tablet Take 1 tablet (25 mg total) by mouth 2 (two) times daily. 03/19/20 08/26/21 Yes Harold Hedge, MD  pantoprazole (PROTONIX) 40 MG tablet Take 1 tablet (40 mg total) by mouth 2 (two) times daily. 03/19/20 08/03/21 Yes Harold Hedge, MD  pravastatin (PRAVACHOL) 20 MG tablet Take 1 tablet (20 mg total) by mouth daily. 03/19/20  Yes Harold Hedge, MD  sucralfate (CARAFATE) 1 g tablet Take 1 tablet (1 g total) by mouth 4 (four) times daily -  with meals and at bedtime. 03/19/20 04/18/20  Harold Hedge, MD    Physical Exam: Constitutional: Moderately built and nourished. Vitals:   08/07/20 0326 08/07/20 0330 08/07/20 0410  BP:  (!) 135/115 112/87  Pulse:  (!) 110 (!) 137  Resp:  (!) 28 18  Temp:  97.6 F (36.4 C) 97.8 F (36.6 C)  TempSrc:   Oral  SpO2:  96% 92%  Weight: (!) 195 kg    Height: _0  (1.88 m)     Eyes: Anicteric no pallor. ENMT: No discharge from the ears eyes nose or mouth. Neck: No mass felt.  No neck rigidity. Respiratory: No rhonchi or crepitations. Cardiovascular: S1-S2 heard. Abdomen: Soft nontender bowel sounds present. Musculoskeletal: No edema. Skin: No rash. Neurologic: Alert awake oriented to time place and person.  Moves all extremities. Psychiatric:  Appears normal.  Normal affect.   Labs on Admission: I have personally reviewed following labs and imaging studies  CBC: Recent Labs  Lab 08/07/20 0349 08/07/20 0452  WBC 13.8*  --   HGB 18.1* 19.4*  HCT 56.3* 57.0*  MCV 76.8*  --   PLT 420*  --    Basic Metabolic Panel: Recent Labs  Lab 08/07/20 0349 08/07/20 0452  NA 138 141  K 3.8 3.8  CL 103  --   CO2 12*  --   GLUCOSE 431*  --   BUN 6  --   CREATININE 1.51*  --   CALCIUM 10.3  --    GFR: Estimated Creatinine Clearance: 137 mL/min (A) (by C-G formula based on SCr of 1.51 mg/dL (H)). Liver Function Tests: Recent Labs  Lab 08/07/20 0349  AST 19    ALT 29  ALKPHOS 127*  BILITOT 1.1  PROT 9.1*  ALBUMIN 4.6   Recent Labs  Lab 08/07/20 0349  LIPASE 23   No results for input(s): AMMONIA in the last 168 hours. Coagulation Profile: No results for input(s): INR, PROTIME in the last 168 hours. Cardiac Enzymes: No results for input(s): CKTOTAL, CKMB, CKMBINDEX, TROPONINI in the last 168 hours. BNP (last 3 results) No results for input(s): PROBNP in the last 8760 hours. HbA1C: No results for input(s): HGBA1C in the last 72 hours. CBG: Recent Labs  Lab 08/07/20 0335  GLUCAP 425*   Lipid Profile: No results for input(s): CHOL, HDL, LDLCALC, TRIG, CHOLHDL, LDLDIRECT in the last 72 hours. Thyroid Function Tests: No results for input(s): TSH, T4TOTAL, FREET4, T3FREE, THYROIDAB in the last 72 hours. Anemia Panel: No results for input(s): VITAMINB12, FOLATE, FERRITIN, TIBC, IRON, RETICCTPCT in the last 72 hours. Urine analysis:    Component Value Date/Time   COLORURINE YELLOW 08/07/2020 0348   APPEARANCEUR HAZY (A) 08/07/2020 0348   LABSPEC 1.026 08/07/2020 0348   PHURINE 5.0 08/07/2020 0348   GLUCOSEU >=500 (A) 08/07/2020 0348   HGBUR SMALL (A) 08/07/2020 0348   BILIRUBINUR NEGATIVE 08/07/2020 0348   KETONESUR 80 (A) 08/07/2020 0348   PROTEINUR >=300 (A) 08/07/2020 0348   UROBILINOGEN 0.2 10/21/2019 1258   NITRITE NEGATIVE 08/07/2020 0348   LEUKOCYTESUR NEGATIVE 08/07/2020 0348   Sepsis Labs: _0 (procalcitonin:4,lacticidven:4) )No results found for this or any previous visit (from the past 240 hour(s)).   Radiological Exams on Admission: No results found.  EKG: Independently reviewed.  Sinus tachycardia.  Assessment/Plan Principal Problem:   DKA (diabetic ketoacidosis) ([place]) Active Problems:   AKI (acute kidney injury) ([place])    1. Diabetic ketoacidosis likely precipitated by patient being noncompliant with his medication for which now patient is on [name] fluids insulin infusion follow metabolic panel  closely until anion gap is corrected change to long-acting insulin.  Check hemoglobin A1c.  Advised about compliance. 2. Nausea vomiting could be from diabetic gastroparesis.  Abdomen appears benign.  Closely monitor. 3. Acute renal failure likely from dehydration continue with hydration follow metabolic panel. 4. History of hypertension per the chart but patient states he has taken her antihypertensives previously but his blood pressures to drop.  Follow blood pressure trends.  We will keep patient on as needed [name] labetalol for now.  Chest x-ray is pending.  Covid test is pending.   DVT prophylaxis: Lovenox. Code Status: Full code. Family Communication: Discussed with patient. Disposition Plan: Home. Consults called: None. Admission status: Observation.   Rise Patience MD [facility] Pager [phone].  If 7PM-7AM, please  contact night-coverage [email or web address] Password [email or web address]  08/07/2020, 5:45 AM

## 2020-08-07 NOTE — ED Notes (Signed)
MD Ghimire made aware of sustained HR 120-140 with periodic episodes of sustained 160.

## 2020-08-07 NOTE — ED Notes (Signed)
Patient CBG  was 209.

## 2020-08-08 LAB — GLUCOSE, CAPILLARY
Glucose-Capillary: 179 mg/dL — ABNORMAL HIGH (ref 70–99)
Glucose-Capillary: 189 mg/dL — ABNORMAL HIGH (ref 70–99)
Glucose-Capillary: 191 mg/dL — ABNORMAL HIGH (ref 70–99)
Glucose-Capillary: 193 mg/dL — ABNORMAL HIGH (ref 70–99)
Glucose-Capillary: 195 mg/dL — ABNORMAL HIGH (ref 70–99)
Glucose-Capillary: 198 mg/dL — ABNORMAL HIGH (ref 70–99)
Glucose-Capillary: 204 mg/dL — ABNORMAL HIGH (ref 70–99)
Glucose-Capillary: 205 mg/dL — ABNORMAL HIGH (ref 70–99)
Glucose-Capillary: 208 mg/dL — ABNORMAL HIGH (ref 70–99)
Glucose-Capillary: 209 mg/dL — ABNORMAL HIGH (ref 70–99)
Glucose-Capillary: 243 mg/dL — ABNORMAL HIGH (ref 70–99)
Glucose-Capillary: 273 mg/dL — ABNORMAL HIGH (ref 70–99)

## 2020-08-08 LAB — BASIC METABOLIC PANEL
Anion gap: 13 (ref 5–15)
BUN: 5 mg/dL — ABNORMAL LOW (ref 6–20)
CO2: 19 mmol/L — ABNORMAL LOW (ref 22–32)
Calcium: 9.8 mg/dL (ref 8.9–10.3)
Chloride: 110 mmol/L (ref 98–111)
Creatinine, Ser: 1.01 mg/dL (ref 0.61–1.24)
GFR, Estimated: >60 mL/min (ref >60)
Glucose, Bld: 237 mg/dL — ABNORMAL HIGH (ref 70–99)
Potassium: 3.8 mmol/L (ref 3.5–5.1)
Sodium: 142 mmol/L (ref 135–145)

## 2020-08-08 LAB — BETA-HYDROXYBUTYRIC ACID: Beta-Hydroxybutyric Acid: 2.91 mmol/L — ABNORMAL HIGH (ref 0.05–0.27)

## 2020-08-08 MED ORDER — INSULIN ASPART 100 UNIT/ML ~~LOC~~ SOLN
0.0000 [IU] | Freq: Every day | SUBCUTANEOUS | Status: DC
  Administered 2020-08-08: 3 [IU] via SUBCUTANEOUS

## 2020-08-08 MED ORDER — INSULIN ASPART 100 UNIT/ML ~~LOC~~ SOLN
0.0000 [IU] | Freq: Three times a day (TID) | SUBCUTANEOUS | Status: DC
  Administered 2020-08-08: 5 [IU] via SUBCUTANEOUS
  Administered 2020-08-09: 8 [IU] via SUBCUTANEOUS
  Administered 2020-08-09: 3 [IU] via SUBCUTANEOUS

## 2020-08-08 MED ORDER — INSULIN NPH (HUMAN) (ISOPHANE) 100 UNIT/ML ~~LOC~~ SUSP
22.0000 [IU] | Freq: Once | SUBCUTANEOUS | Status: AC
  Administered 2020-08-08: 22 [IU] via SUBCUTANEOUS
  Filled 2020-08-08: qty 10

## 2020-08-08 MED ORDER — INSULIN ASPART PROT & ASPART (70-30 MIX) 100 UNIT/ML ~~LOC~~ SUSP
30.0000 [IU] | Freq: Every day | SUBCUTANEOUS | Status: DC
  Administered 2020-08-08: 30 [IU] via SUBCUTANEOUS
  Filled 2020-08-08: qty 10

## 2020-08-08 MED ORDER — PANTOPRAZOLE SODIUM 40 MG PO TBEC
40.0000 mg | DELAYED_RELEASE_TABLET | Freq: Every day | ORAL | Status: DC
  Administered 2020-08-08 – 2020-08-09 (×2): 40 mg via ORAL
  Filled 2020-08-08 (×2): qty 1

## 2020-08-08 MED ORDER — INSULIN ASPART PROT & ASPART (70-30 MIX) 100 UNIT/ML ~~LOC~~ SUSP
35.0000 [IU] | Freq: Every day | SUBCUTANEOUS | Status: DC
  Administered 2020-08-09: 35 [IU] via SUBCUTANEOUS
  Filled 2020-08-08: qty 10

## 2020-08-08 NOTE — Progress Notes (Signed)
PROGRESS NOTE        PATIENT DETAILS Name: Michael [name] Age: 24 y.o. Sex: male Date of Birth: June 24, 1996 Admit Date: 08/07/2020 Admitting Physician Eduard Clos, MD STM:[id number], Kinnie Scales, NP  Brief Narrative: Patient is a 24 y.o. male with history of DM-one-noncompliant to medications-stopped taking insulin 2 months back-presenting with nausea/vomiting for 2 days-found to have diabetic ketoacidosis.  Significant events: 10/30>> presenting with nausea/vomiting-found to have DKA.  Significant studies: 10/30>> chest x-ray: No abnormalities.  Antimicrobial therapy: None  Microbiology data: None  Procedures : None  Consults: None  DVT Prophylaxis : Prophylactic Lovenox   Subjective: No vomiting but still complains of nausea-nurse reports poor appetite.  Assessment/Plan: DKA: Provoked by noncompliance-resolved-stop [name] insulin today-transition to SQ insulin.  AKI: Likely hemodynamic mediated in the setting of DKA-resolved.  Nausea/vomiting: Vomiting has resolved-still with some nausea-continue to manage with supportive care.  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 Have counseled extensively regarding importance of compliance. None the DKA is resolved-we will place back on insulin 70/30 30 units twice daily. Patient's appetite is very poor due to nausea-watch closely-if CBGs remain stable-suspect he could be discharged home tomorrow morning.  ?  History of hypertension: BP stable-not on any antihypertensives for now-follow and start accordingly.  Morbid Obesity: Estimated body mass index is 55.21 kg/m as calculated from the following:   Height as of this encounter: 6\' 2"  (1.88 m).   Weight as of this encounter: 195 kg.   Diet: Diet Order            Diet bariatric advanced Room service appropriate? Yes; Fluid consistency: Thin  Diet  effective now                  Code Status: Full code   Family Communication: Patient prefers to update family himself.   Disposition Plan: Status is: Observation  The patient remains OBS appropriate and will d/c before 2 midnights.  Dispo: The patient is from: Home              Anticipated d/c is to: Home              Anticipated d/c date is: 1 day              Patient currently is not medically stable to d/c.   Barriers to Discharge: DKA-transition to SQ insulin today-still with nausea-  Antimicrobial agents: Anti-infectives (From admission, onward)   None       Time spent: 25-minutes-Greater than 50% of this time was spent in counseling, explanation of diagnosis, planning of further management, and coordination of care.  MEDICATIONS: Scheduled Meds: . enoxaparin (LOVENOX) injection  100 mg Subcutaneous Q24H  . insulin aspart  0-15 Units Subcutaneous TID WC  . insulin aspart  0-5 Units Subcutaneous QHS  . insulin aspart protamine- aspart  30 Units Subcutaneous Q supper   Continuous Infusions:  PRN Meds:.dextrose, labetalol   PHYSICAL EXAM: Vital signs: Vitals:   08/08/20 0139 08/08/20 0427 08/08/20 0843 08/08/20 1220  BP: (!) 152/87 (!) 144/101 (!) 147/91 (!) 147/88  Pulse: 100 100 90 92  Resp: 16  18 18   Temp: 98 F (36.7 C) 97.6 F (36.4 C) 97.7 F (36.5 C) 97.9 F (36.6 C)  TempSrc: Oral  Oral Oral  SpO2: 99% 98% 100% 99%  Weight:      Height:       Filed Weights   08/07/20 0326  Weight: (!) 195 kg   Body mass index is 55.21 kg/m.   Gen Exam:Alert awake-not in any distress HEENT:atraumatic, normocephalic Chest: B/L clear to auscultation anteriorly CVS:S1S2 regular Abdomen:soft non tender, non distended Extremities:no edema Neurology: Non focal Skin: no rash  I have personally reviewed following labs and imaging studies  LABORATORY DATA: CBC: Recent Labs  Lab 08/07/20 0349 08/07/20 0452  WBC 13.8*  --   HGB 18.1* 19.4*    HCT 56.3* 57.0*  MCV 76.8*  --   PLT 420*  --     Basic Metabolic Panel: Recent Labs  Lab 08/07/20 0349 08/07/20 0349 08/07/20 0452 08/07/20 0946 08/07/20 1440 08/07/20 1743 08/08/20 0121  NA 138   < > 141 143 142 143 142  K 3.8   < > 3.8 3.8 4.1 3.5 3.8  CL 103  --   --  109 109 110 110  CO2 12*  --   --  14* 15* 16* 19*  GLUCOSE 431*  --   --  181* 211* 229* 237*  BUN 6  --   --  5* <5* <5* 5*  CREATININE 1.51*  --   --  1.26* 1.14 1.17 1.01  CALCIUM 10.3  --   --  10.0 9.7 9.8 9.8   < > = values in this interval not displayed.    GFR: Estimated Creatinine Clearance: 204.8 mL/min (by C-G formula based on SCr of 1.01 mg/dL).  Liver Function Tests: Recent Labs  Lab 08/07/20 0349  AST 19  ALT 29  ALKPHOS 127*  BILITOT 1.1  PROT 9.1*  ALBUMIN 4.6   Recent Labs  Lab 08/07/20 0349  LIPASE 23   No results for input(s): AMMONIA in the last 168 hours.  Coagulation Profile: No results for input(s): INR, PROTIME in the last 168 hours.  Cardiac Enzymes: No results for input(s): CKTOTAL, CKMB, CKMBINDEX, TROPONINI in the last 168 hours.  BNP (last 3 results) No results for input(s): PROBNP in the last 8760 hours.  Lipid Profile: No results for input(s): CHOL, HDL, LDLCALC, TRIG, CHOLHDL, LDLDIRECT in the last 72 hours.  Thyroid Function Tests: No results for input(s): TSH, T4TOTAL, FREET4, T3FREE, THYROIDAB in the last 72 hours.  Anemia Panel: No results for input(s): VITAMINB12, FOLATE, FERRITIN, TIBC, IRON, RETICCTPCT in the last 72 hours.  Urine analysis:    Component Value Date/Time   COLORURINE YELLOW 08/07/2020 0348   APPEARANCEUR HAZY (A) 08/07/2020 0348   LABSPEC 1.026 08/07/2020 0348   PHURINE 5.0 08/07/2020 0348   GLUCOSEU >=500 (A) 08/07/2020 0348   HGBUR SMALL (A) 08/07/2020 0348   BILIRUBINUR NEGATIVE 08/07/2020 0348   KETONESUR 80 (A) 08/07/2020 0348   PROTEINUR >=300 (A) 08/07/2020 0348   UROBILINOGEN 0.2 10/21/2019 1258   NITRITE  NEGATIVE 08/07/2020 0348   LEUKOCYTESUR NEGATIVE 08/07/2020 0348    Sepsis Labs: Lactic Acid, Venous    Component Value Date/Time   LATICACIDVEN 3.2 (HH) 08/07/2020 0349    MICROBIOLOGY: Recent Results (from the past 240 hour(s))  Respiratory Panel by RT PCR (Flu A&B, Covid) - Nasopharyngeal Swab     Status: None   Collection Time: 08/07/20  6:45 AM   Specimen: Nasopharyngeal Swab  Result Value Ref Range Status   SARS Coronavirus 2 by RT PCR NEGATIVE NEGATIVE Final    Comment: (NOTE) SARS-CoV-2 target nucleic acids  are NOT DETECTED.  The SARS-CoV-2 RNA is generally detectable in upper respiratoy specimens during the acute phase of infection. The lowest concentration of SARS-CoV-2 viral copies this assay can detect is 131 copies/mL. A negative result does not preclude SARS-Cov-2 infection and should not be used as the sole basis for treatment or other patient management decisions. A negative result may occur with  improper specimen collection/handling, submission of specimen other than nasopharyngeal swab, presence of viral mutation(s) within the areas targeted by this assay, and inadequate number of viral copies (<131 copies/mL). A negative result must be combined with clinical observations, patient history, and epidemiological information. The expected result is Negative.  Fact Sheet for Patients:  https://www.moore.com/  Fact Sheet for Healthcare Providers:  https://www.young.biz/  This test is no t yet approved or cleared by the [place] FDA and  has been authorized for detection and/or diagnosis of SARS-CoV-2 by FDA under an Emergency Use Authorization (EUA). This EUA will remain  in effect (meaning this test can be used) for the duration of the COVID-19 declaration under Section 564(b)(1) of the Act, 21 U.S.C. section 360bbb-3(b)(1), unless the authorization is terminated or revoked sooner.     Influenza A by PCR  NEGATIVE NEGATIVE Final   Influenza B by PCR NEGATIVE NEGATIVE Final    Comment: (NOTE) The Xpert Xpress SARS-CoV-2/FLU/RSV assay is intended as an aid in  the diagnosis of influenza from Nasopharyngeal swab specimens and  should not be used as a sole basis for treatment. Nasal washings and  aspirates are unacceptable for Xpert Xpress SARS-CoV-2/FLU/RSV  testing.  Fact Sheet for Patients: https://www.moore.com/  Fact Sheet for Healthcare Providers: https://www.young.biz/  This test is not yet approved or cleared by the [place] FDA and  has been authorized for detection and/or diagnosis of SARS-CoV-2 by  FDA under an Emergency Use Authorization (EUA). This EUA will remain  in effect (meaning this test can be used) for the duration of the  Covid-19 declaration under Section 564(b)(1) of the Act, 21  U.S.C. section 360bbb-3(b)(1), unless the authorization is  terminated or revoked. Performed at [facility], [address] [place], [place] [address]     RADIOLOGY STUDIES/RESULTS: DG CHEST PORT 1 VIEW  Result Date: 08/07/2020 CLINICAL DATA:  Shortness of breath EXAM: PORTABLE CHEST 1 VIEW COMPARISON:  03/15/2020 FINDINGS: Low lung volumes. No new consolidation or edema. No pleural effusion or pneumothorax. Cardiomediastinal contours are within normal limits. IMPRESSION: Low lung volumes.  No acute process in the chest. Electronically Signed   By: Guadlupe Spanish M.D.   On: 08/07/2020 07:12     LOS: 0 days   Jeoffrey Massed, MD  [facility]    To contact the attending provider between 7A-7P or the covering provider during after hours 7P-7A, please log into the web site [email or web address] and access using universal [place] password for that web site. If you do not have the password, please call the hospital operator.  08/08/2020, 1:14 PM

## 2020-08-08 NOTE — Care Management (Addendum)
Notified by MD that patient will be d/c today and needs assistance with medications/PCP.  Met with patient at bedside to discuss plan for d/c. Patient used [place] program in 1/21 and set up with [facility].  Patient states that appointment was a telephonic appt and they adjusted his medications and he filled them.  He states that after that he was trying to get insulin and other medications from [facility] and it was too expensive, so he was not able to get them.  He states he doesn't know why [facility] did not help him with prescriptions or make him more appointments.  Patient states he lives alone in an apartment and does not work.  He has no income apart from rental/government assistance.  He has applied for Medicaid but doesn't know why he was denied.  His mother will pick him up today and she is available to assist him somewhat.  He states he stays with her sometimes.   Reinforced to patient that he must f/u and get established with clinic.  Reiterated to him that he will receive medical care, assistance with prescriptions and application for Medicaid.  Discussed that [place] program is supposed to be used once/year.  We will make exception today, but he must make a plan to fill prescriptions independently.  Patient verbalizes understanding and agrees.  Patient given [facility] letter and list.  1400- update: d/c on hold until tomorrow.  Prescriptions can be filled by [facility] tomorrow with [facility].

## 2020-08-09 ENCOUNTER — Other Ambulatory Visit (HOSPITAL_COMMUNITY): Payer: Self-pay | Admitting: Internal Medicine

## 2020-08-09 LAB — GLUCOSE, CAPILLARY
Glucose-Capillary: 165 mg/dL — ABNORMAL HIGH (ref 70–99)
Glucose-Capillary: 255 mg/dL — ABNORMAL HIGH (ref 70–99)
Glucose-Capillary: 258 mg/dL — ABNORMAL HIGH (ref 70–99)
Glucose-Capillary: 262 mg/dL — ABNORMAL HIGH (ref 70–99)

## 2020-08-09 MED ORDER — PANTOPRAZOLE SODIUM 40 MG PO TBEC: 40.0000 mg | DELAYED_RELEASE_TABLET | Freq: Every day | ORAL | 0 refills | Status: DC

## 2020-08-09 MED ORDER — NOVOLIN 70/30 FLEXPEN RELION (70-30) 100 UNIT/ML ~~LOC~~ SUPN
35.0000 [IU] | PEN_INJECTOR | Freq: Two times a day (BID) | SUBCUTANEOUS | 1 refills | Status: DC
Start: 2020-08-09 — End: 2020-08-09

## 2020-08-09 MED ORDER — PRAVASTATIN SODIUM 20 MG PO TABS: 20.0000 mg | ORAL_TABLET | Freq: Every day | ORAL | 1 refills | Status: DC

## 2020-08-09 MED FILL — PENTIPS 32G X 4 MM MISC: 32G X 4 MM | 30 days supply | Qty: 100 | Fill #0

## 2020-08-09 MED FILL — PRAVASTATIN NA 20 MG TAB: 20 | 30 days supply | Qty: 30 | Fill #0

## 2020-08-09 MED FILL — PANTOPRAZOLE SOD DR 40 MG T: 40 | 30 days supply | Qty: 30 | Fill #0

## 2020-08-09 MED FILL — NOVOLIN 70/30 FLEXPEN (70-3: (70-30) 100 | 30 days supply | Qty: 21 | Fill #0

## 2020-08-09 NOTE — TOC Transition Note (Signed)
Transition of Care [facility]) - CM/SW Discharge Note   Patient Details  Name: Michael [name] MRN: [id number] Date of Birth: 03-Jan-1996  [facility] CM/SW Contact:  Kermit Balo, RN Phone Number: 08/09/2020, 11:05 AM   Clinical Narrative:    Pt discharging home with self care. Medications sent to [facility] and MATCH applied.  Appt made at [facility] and pt can use [facility] pharmacy for his medications after d/c.    Final next level of care: Home/Self Care Barriers to Discharge: Inadequate or no insurance, Barriers Unresolved (comment)   Patient Goals and CMS Choice        Discharge Placement                       Discharge Plan and Services                                     Social Determinants of Health (SDOH) Interventions     Readmission Risk Interventions No flowsheet data found.

## 2020-08-09 NOTE — Progress Notes (Signed)
Inpatient Diabetes Program Recommendations  AACE/ADA: New Consensus Statement on Inpatient Glycemic Control (2015)  Target Ranges:  Prepandial:   less than 140 mg/dL      Peak postprandial:   less than 180 mg/dL (1-2 hours)      Critically ill patients:  140 - 180 mg/dL   Lab Results  Component Value Date   GLUCAP 165 (H) 08/09/2020   HGBA1C 12.5 (H) 08/07/2020    Review of Glycemic Control Results for Michael [name], Michael [name] (MRN [id number]) as of 08/09/2020 14:03  Ref. Range 08/08/2020 19:58 08/09/2020 00:19 08/09/2020 04:18 08/09/2020 06:43 08/09/2020 11:15  Glucose-Capillary Latest Ref Range: 70 - 99 mg/dL 884 (H) 166 (H) 063 (H) 255 (H) 165 (H)   Diabetes history:  DM2 Outpatient Diabetes medications:  70/30 35 units bid (not taking) Metformin 1000 mg  Note: Patient is discharging today.  Spoke with him a t bedside.  Reviewed patient's current A1c of 12.5% (average cbg of 312 mg/dL). Explained what a A1c is and what it measures. Also reviewed goal A1c with patient, importance of good glucose control @ home, and blood sugar goals.  He states he was unable to afford his insulin for the past 2 mos.  He was able to obtain Metformin.  TOC has helped him with an appointment with the [facility] and [facility] for medication needs.   Reviewed The Plate Method and importance of good blood glucose control to avoid long and short term complications.  He will be discharging with his mother and staying with her.  Encouraged him to check his CBG's 3 times a day.  He has a functioning glucometer.     Will continue to follow while inpatient.  Thank you, Dulce Sellar, RN, BSN Diabetes Coordinator Inpatient Diabetes Program [phone] (team pager from 8a-5p)

## 2020-08-09 NOTE — Discharge Summary (Signed)
PATIENT DETAILS Name: Michael [name] Age: 24 y.o. Sex: male Date of Birth: [birth year] MRN: [id number]. Admitting Physician: Rise Patience, MD RSW:NIOEVOJ, [place] Cage, NP  Admit Date: 08/07/2020 Discharge date: 08/09/2020  Recommendations for Outpatient Follow-up:  1. Follow up with PCP in 1-2 weeks 2. Please obtain CMP/CBC in one week 3. Please continue to optimize diabetic regimen.  Admitted From:  Home  Disposition: [place]: No  Equipment/Devices: None  Discharge Condition: Stable  CODE STATUS: FULL CODE  Diet recommendation:  Diet Order            Diet - low sodium heart healthy           Diet Carb Modified           Diet bariatric advanced Room service appropriate? Yes; Fluid consistency: Thin  Diet effective now                  Brief Narrative: Patient is a 24 y.o. male with history of DM-one-noncompliant to medications-stopped taking insulin 2 months back-presenting with nausea/vomiting for 2 days-found to have diabetic ketoacidosis.  Significant events: 10/30>> presenting with nausea/vomiting-found to have DKA.  Significant studies: 10/30>> chest x-ray: No abnormalities.  Antimicrobial therapy: None  Microbiology data: None  Procedures : None  Consults: None  Brief Hospital Course: DKA: Provoked by noncompliance-resolved-stop [name] insulin today-transition to SQ insulin.  AKI: Likely hemodynamic mediated in the setting of DKA-resolved.  Nausea/vomiting: Vomiting resolved yesterday-nausea completely resolved today-feels much better.  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 Have counseled extensively regarding importance of compliance.  After DKA was resolved-patient was placed back on insulin 70/30-we will increase to 35 units twice daily-he will be discharged home today-he will follow with his primary care practitioner  for further optimization of his diabetic regimen.    ?  History of hypertension: BP stable-not on any antihypertensives for now-follow and start accordingly.  Morbid Obesity: Estimated body mass index is 55.21 kg/m as calculated from the following:   Height as of this encounter: _0  (1.88 m).   Weight as of this encounter: 195 kg.    Discharge Diagnoses:  Principal Problem:   DKA (diabetic ketoacidosis) ([place]) Active Problems:   AKI (acute kidney injury) [facility])   Discharge Instructions:  Activity:  As tolerated  Discharge Instructions    Call MD for:  extreme fatigue   Complete by: As directed    Call MD for:  persistant dizziness or light-headedness   Complete by: As directed    Call MD for:  persistant nausea and vomiting   Complete by: As directed    Diet - low sodium heart healthy   Complete by: As directed    Diet Carb Modified   Complete by: As directed    Discharge instructions   Complete by: As directed    Follow with Primary MD  Kerin Perna, NP in 1-2 weeks  Please keep a record of your CBG readings-take it with you to your next appointment with your primary care practitioner.  Please get a complete blood count and chemistry panel checked by your Primary MD at your next visit, and again as instructed by your Primary MD.  Get Medicines reviewed and adjusted: Please take all your medications with you for your next visit with your Primary MD  Laboratory/radiological data: Please request your Primary MD to go over all hospital tests and  procedure/radiological results at the follow up, please ask your Primary MD to get all Hospital records sent to his/her office.  In some cases, they will be blood work, cultures and biopsy results pending at the time of your discharge. Please request that your primary care M.D. follows up on these results.  Also Note the following: If you experience worsening of your admission symptoms, develop shortness of breath,  life threatening emergency, suicidal or homicidal thoughts you must seek medical attention immediately by calling 911 or calling your MD immediately  if symptoms less severe.  You must read complete instructions/literature along with all the possible adverse reactions/side effects for all the Medicines you take and that have been prescribed to you. Take any new Medicines after you have completely understood and accpet all the possible adverse reactions/side effects.   Do not drive when taking Pain medications or sleeping medications (Benzodaizepines)  Do not take more than prescribed Pain, Sleep and Anxiety Medications. It is not advisable to combine anxiety,sleep and pain medications without talking with your primary care practitioner  Special Instructions: If you have smoked or chewed Tobacco  in the last 2 yrs please stop smoking, stop any regular Alcohol  and or any Recreational drug use.  Wear Seat belts while driving.  Please note: You were cared for by a hospitalist during your hospital stay. Once you are discharged, your primary care physician will handle any further medical issues. Please note that NO REFILLS for any discharge medications will be authorized once you are discharged, as it is imperative that you return to your primary care physician (or establish a relationship with a primary care physician if you do not have one) for your post hospital discharge needs so that they can reassess your need for medications and monitor your lab values.   Increase activity slowly   Complete by: As directed      Allergies as of 08/09/2020   No Known Allergies     Medication List    STOP taking these medications   lisinopril 20 MG tablet Commonly known as: ZESTRIL   metFORMIN 1000 MG tablet Commonly known as: GLUCOPHAGE   metoprolol tartrate 25 MG tablet Commonly known as: LOPRESSOR   sucralfate 1 g tablet Commonly known as: CARAFATE     TAKE these medications   blood glucose  meter kit and supplies Dispense based on patient and insurance preference. Use up to four times daily as directed. (FOR ICD-9 250.00, 250.01).   NovoLIN 70/30 FlexPen Relion (70-30) 100 UNIT/ML KwikPen Generic drug: insulin isophane & regular human Inject 35 Units into the skin 2 (two) times daily.   pantoprazole 40 MG tablet Commonly known as: PROTONIX Take 1 tablet (40 mg total) by mouth daily. What changed: when to take this   pravastatin 20 MG tablet Commonly known as: PRAVACHOL Take 1 tablet (20 mg total) by mouth daily.       Follow-up Information    [place]. Call.   Why: This is an option for primary care.  Pharmacy services are available.  Contact information: [place] [phone] [place]. Call.   Specialty: Internal Medicine Why: This is an option for primary care. Pharmacy services are available Contact information: [place] [id number] [place] [phone]       Primary Care at [facility]. Call.   Specialty: Family Medicine Why: This  is an option for primary care. Pharmacy services are available Contact information: [address], [place] [phone]             No Known Allergies     Other Procedures/Studies: DG CHEST PORT 1 VIEW  Result Date: 08/07/2020 CLINICAL DATA:  Shortness of breath EXAM: PORTABLE CHEST 1 VIEW COMPARISON:  03/15/2020 FINDINGS: Low lung volumes. No new consolidation or edema. No pleural effusion or pneumothorax. Cardiomediastinal contours are within normal limits. IMPRESSION: Low lung volumes.  No acute process in the chest. Electronically Signed   By: Macy Mis M.D.   On: 08/07/2020 07:12     TODAY-DAY OF DISCHARGE:  Subjective:   Michael [name] today has no headache,no chest abdominal pain,no new weakness tingling or  numbness, feels much better wants to go home today.   Objective:   Blood pressure 137/83, pulse 82, temperature 98.2 F (36.8 C), temperature source Oral, resp. rate 14, height _0  (1.88 m), weight (!) 195 kg, SpO2 96 %.  Intake/Output Summary (Last 24 hours) at 08/09/2020 0851 Last data filed at 08/09/2020 0732 Gross per 24 hour  Intake 2568.31 ml  Output 1100 ml  Net 1468.31 ml   Filed Weights   08/07/20 0326  Weight: (!) 195 kg    Exam: Awake Alert, Oriented *3, No new F.N deficits, Normal affect [place].AT,PERRAL Supple Neck,No JVD, No cervical lymphadenopathy appriciated.  Symmetrical Chest wall movement, Good air movement bilaterally, CTAB RRR,No Gallops,Rubs or new Murmurs, No Parasternal Heave +ve B.Sounds, Abd Soft, Non tender, No organomegaly appriciated, No rebound -guarding or rigidity. No Cyanosis, Clubbing or edema, No new Rash or bruise   PERTINENT RADIOLOGIC STUDIES: No results found.   PERTINENT LAB RESULTS: CBC: Recent Labs    08/07/20 0349 08/07/20 0452  WBC 13.8*  --   HGB 18.1* 19.4*  HCT 56.3* 57.0*  PLT 420*  --    CMET CMP     Component Value Date/Time   NA 142 08/08/2020 0121   K 3.8 08/08/2020 0121   CL 110 08/08/2020 0121   CO2 19 (L) 08/08/2020 0121   GLUCOSE 237 (H) 08/08/2020 0121   BUN 5 (L) 08/08/2020 0121   CREATININE 1.01 08/08/2020 0121   CALCIUM 9.8 08/08/2020 0121   PROT 9.1 (H) 08/07/2020 0349   ALBUMIN 4.6 08/07/2020 0349   AST 19 08/07/2020 0349   ALT 29 08/07/2020 0349   ALKPHOS 127 (H) 08/07/2020 0349   BILITOT 1.1 08/07/2020 0349   GFRNONAA >60 08/08/2020 0121   GFRAA >60 03/19/2020 1531    GFR Estimated Creatinine Clearance: 204.8 mL/min (by C-G formula based on SCr of 1.01 mg/dL). Recent Labs    08/07/20 0349  LIPASE 23   No results for input(s): CKTOTAL, CKMB, CKMBINDEX, TROPONINI in the last 72 hours. Invalid input(s): POCBNP No results for input(s): DDIMER in the last 72 hours. Recent Labs     08/07/20 0349  HGBA1C 12.5*   No results for input(s): CHOL, HDL, LDLCALC, TRIG, CHOLHDL, LDLDIRECT in the last 72 hours. No results for input(s): TSH, T4TOTAL, T3FREE, THYROIDAB in the last 72 hours.  Invalid input(s): FREET3 No results for input(s): VITAMINB12, FOLATE, FERRITIN, TIBC, IRON, RETICCTPCT in the last 72 hours. Coags: No results for input(s): INR in the last 72 hours.  Invalid input(s): PT Microbiology: Recent Results (from the past 240 hour(s))  Respiratory Panel by RT PCR (Flu A&B, Covid) - Nasopharyngeal Swab     Status: None  Collection Time: 08/07/20  6:45 AM   Specimen: Nasopharyngeal Swab  Result Value Ref Range Status   SARS Coronavirus 2 by RT PCR NEGATIVE NEGATIVE Final    Comment: (NOTE) SARS-CoV-2 target nucleic acids are NOT DETECTED.  The SARS-CoV-2 RNA is generally detectable in upper respiratoy specimens during the acute phase of infection. The lowest concentration of SARS-CoV-2 viral copies this assay can detect is 131 copies/mL. A negative result does not preclude SARS-Cov-2 infection and should not be used as the sole basis for treatment or other patient management decisions. A negative result may occur with  improper specimen collection/handling, submission of specimen other than nasopharyngeal swab, presence of viral mutation(s) within the areas targeted by this assay, and inadequate number of viral copies (<131 copies/mL). A negative result must be combined with clinical observations, patient history, and epidemiological information. The expected result is Negative.  Fact Sheet for Patients:  PinkCheek.be  Fact Sheet for Healthcare Providers:  GravelBags.it  This test is no t yet approved or cleared by the [place] FDA and  has been authorized for detection and/or diagnosis of SARS-CoV-2 by FDA under an Emergency Use Authorization (EUA). This EUA will remain  in effect  (meaning this test can be used) for the duration of the COVID-19 declaration under Section 564(b)(1) of the Act, 21 U.S.C. section 360bbb-3(b)(1), unless the authorization is terminated or revoked sooner.     Influenza A by PCR NEGATIVE NEGATIVE Final   Influenza B by PCR NEGATIVE NEGATIVE Final    Comment: (NOTE) The Xpert Xpress SARS-CoV-2/FLU/RSV assay is intended as an aid in  the diagnosis of influenza from Nasopharyngeal swab specimens and  should not be used as a sole basis for treatment. Nasal washings and  aspirates are unacceptable for Xpert Xpress SARS-CoV-2/FLU/RSV  testing.  Fact Sheet for Patients: PinkCheek.be  Fact Sheet for Healthcare Providers: GravelBags.it  This test is not yet approved or cleared by the [place] FDA and  has been authorized for detection and/or diagnosis of SARS-CoV-2 by  FDA under an Emergency Use Authorization (EUA). This EUA will remain  in effect (meaning this test can be used) for the duration of the  Covid-19 declaration under Section 564(b)(1) of the Act, 21  U.S.C. section 360bbb-3(b)(1), unless the authorization is  terminated or revoked. Performed at [facility], [place] [address], [place], [place] [address]     FURTHER DISCHARGE INSTRUCTIONS:  Get Medicines reviewed and adjusted: Please take all your medications with you for your next visit with your Primary MD  Laboratory/radiological data: Please request your Primary MD to go over all hospital tests and procedure/radiological results at the follow up, please ask your Primary MD to get all Hospital records sent to his/her office.  In some cases, they will be blood work, cultures and biopsy results pending at the time of your discharge. Please request that your primary care M.D. goes through all the records of your hospital data and follows up on these results.  Also Note the following: If you experience  worsening of your admission symptoms, develop shortness of breath, life threatening emergency, suicidal or homicidal thoughts you must seek medical attention immediately by calling 911 or calling your MD immediately  if symptoms less severe.  You must read complete instructions/literature along with all the possible adverse reactions/side effects for all the Medicines you take and that have been prescribed to you. Take any new Medicines after you have completely understood and accpet all the possible adverse reactions/side  effects.   Do not drive when taking Pain medications or sleeping medications (Benzodaizepines)  Do not take more than prescribed Pain, Sleep and Anxiety Medications. It is not advisable to combine anxiety,sleep and pain medications without talking with your primary care practitioner  Special Instructions: If you have smoked or chewed Tobacco  in the last 2 yrs please stop smoking, stop any regular Alcohol  and or any Recreational drug use.  Wear Seat belts while driving.  Please note: You were cared for by a hospitalist during your hospital stay. Once you are discharged, your primary care physician will handle any further medical issues. Please note that NO REFILLS for any discharge medications will be authorized once you are discharged, as it is imperative that you return to your primary care physician (or establish a relationship with a primary care physician if you do not have one) for your post hospital discharge needs so that they can reassess your need for medications and monitor your lab values.  Total Time spent coordinating discharge including counseling, education and face to face time equals 35 minutes.  SignedOren Binet 08/09/2020 8:51 AM

## 2020-08-10 ENCOUNTER — Telehealth: Payer: Self-pay

## 2020-08-10 NOTE — Telephone Encounter (Signed)
Transition Care Management Unsuccessful Follow-up Telephone Call  Date of discharge and from where:  08/09/2020, [facility]   Attempts:  1st Attempt  Reason for unsuccessful TCM follow-up call:  Left voice message.  Messages left at # [phone] and # [phone]-call back requested   He has an appointment at [facility] 08/17/2020.

## 2020-08-11 ENCOUNTER — Telehealth: Payer: Self-pay

## 2020-08-11 NOTE — Telephone Encounter (Signed)
Transition Care Management Unsuccessful Follow-up Telephone Call  Date of discharge and from where:  08/09/2020, [facility]   Attempts:  2nd Attempt  Reason for unsuccessful TCM follow-up call:  Unable to reach patient - call placed to # [phone], message left with call back requested to this CM.  Call placed to # [phone], his mother answered and said he was in the bathroom and she would give him the message to return the call.  He has an appointment at [facility] 08/17/2020

## 2020-08-17 ENCOUNTER — Inpatient Hospital Stay (INDEPENDENT_AMBULATORY_CARE_PROVIDER_SITE_OTHER): Payer: Medicaid Other | Admitting: Primary Care

## 2022-06-15 IMAGING — DX DG CHEST 1V PORT
1 series · 1 of 1 positions shown · non-contrast
Comparison: 03/15/2020

CLINICAL DATA: Shortness of breath

EXAM:
PORTABLE CHEST 1 VIEW

[chest]
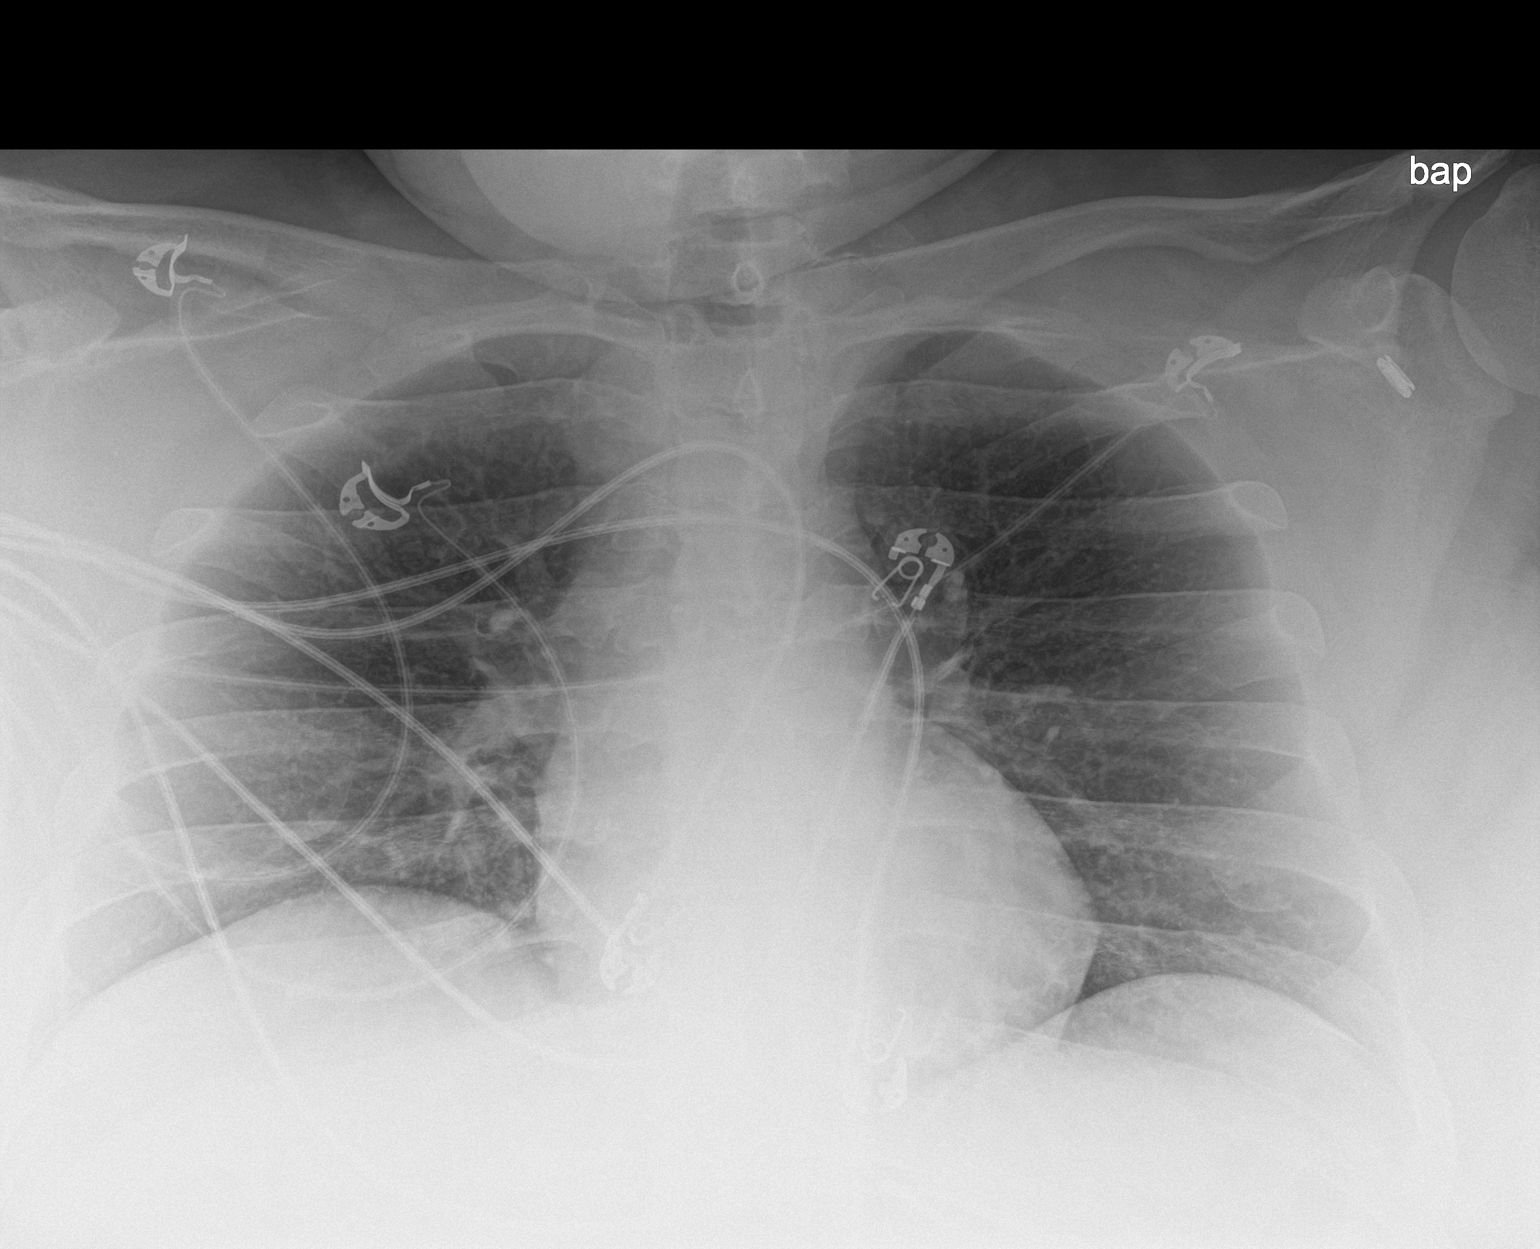

[1 of 1 positions shown; findings below may reference images not displayed]

FINDINGS: Low lung volumes. No new consolidation or edema. No pleural effusion
or pneumothorax. Cardiomediastinal contours are within normal
limits.
IMPRESSION: Low lung volumes.  No acute process in the chest.
